# Patient Record
Sex: Female | Born: 1945 | Race: Black or African American | Hispanic: No | Smoking: Former smoker
Health system: Southern US, Community
[De-identification: ages and names within clinical notes are randomized; demographics above are authoritative.]

## PROBLEM LIST (undated history)

## (undated) DIAGNOSIS — F4320 Adjustment disorder, unspecified: Secondary | ICD-10-CM

## (undated) DIAGNOSIS — I10 Essential (primary) hypertension: Secondary | ICD-10-CM

## (undated) DIAGNOSIS — N189 Chronic kidney disease, unspecified: Secondary | ICD-10-CM

## (undated) DIAGNOSIS — K219 Gastro-esophageal reflux disease without esophagitis: Secondary | ICD-10-CM

## (undated) DIAGNOSIS — R519 Headache, unspecified: Secondary | ICD-10-CM

## (undated) DIAGNOSIS — M1990 Unspecified osteoarthritis, unspecified site: Secondary | ICD-10-CM

## (undated) DIAGNOSIS — E559 Vitamin D deficiency, unspecified: Secondary | ICD-10-CM

## (undated) DIAGNOSIS — E785 Hyperlipidemia, unspecified: Secondary | ICD-10-CM

## (undated) DIAGNOSIS — M25552 Pain in left hip: Secondary | ICD-10-CM

## (undated) DIAGNOSIS — R51 Headache: Secondary | ICD-10-CM

## (undated) DIAGNOSIS — J309 Allergic rhinitis, unspecified: Secondary | ICD-10-CM

## (undated) DIAGNOSIS — M25551 Pain in right hip: Secondary | ICD-10-CM

## (undated) DIAGNOSIS — N289 Disorder of kidney and ureter, unspecified: Secondary | ICD-10-CM

## (undated) DIAGNOSIS — Z9889 Other specified postprocedural states: Secondary | ICD-10-CM

## (undated) HISTORY — DX: Pain in left hip: M25.551

## (undated) HISTORY — DX: Headache: R51

## (undated) HISTORY — DX: Disorder of kidney and ureter, unspecified: N28.9

## (undated) HISTORY — DX: Hyperlipidemia, unspecified: E78.5

## (undated) HISTORY — DX: Vitamin D deficiency, unspecified: E55.9

## (undated) HISTORY — DX: Unspecified osteoarthritis, unspecified site: M19.90

## (undated) HISTORY — PX: BACK SURGERY: SHX140

## (undated) HISTORY — DX: Allergic rhinitis, unspecified: J30.9

## (undated) HISTORY — DX: Essential (primary) hypertension: I10

## (undated) HISTORY — DX: Adjustment disorder, unspecified: F43.20

## (undated) HISTORY — DX: Pain in right hip: M25.552

## (undated) HISTORY — DX: Headache, unspecified: R51.9

## (undated) HISTORY — DX: Other specified postprocedural states: Z98.890

## (undated) HISTORY — DX: Chronic kidney disease, unspecified: N18.9

---

## 1993-12-23 HISTORY — PX: ABDOMINAL HYSTERECTOMY: SHX81

## 1997-11-29 ENCOUNTER — Ambulatory Visit (HOSPITAL_COMMUNITY): Admission: RE | Admit: 1997-11-29 | Discharge: 1997-11-29 | Payer: Self-pay | Admitting: Internal Medicine

## 1998-04-12 ENCOUNTER — Ambulatory Visit (HOSPITAL_COMMUNITY): Admission: RE | Admit: 1998-04-12 | Discharge: 1998-04-12 | Payer: Self-pay | Admitting: Internal Medicine

## 2001-03-18 ENCOUNTER — Other Ambulatory Visit: Admission: RE | Admit: 2001-03-18 | Discharge: 2001-03-18 | Payer: Self-pay | Admitting: Obstetrics & Gynecology

## 2003-03-20 ENCOUNTER — Other Ambulatory Visit: Admission: RE | Admit: 2003-03-20 | Discharge: 2003-03-20 | Payer: Self-pay | Admitting: Obstetrics & Gynecology

## 2004-06-10 ENCOUNTER — Other Ambulatory Visit: Admission: RE | Admit: 2004-06-10 | Discharge: 2004-06-10 | Payer: Self-pay | Admitting: Obstetrics & Gynecology

## 2004-07-01 ENCOUNTER — Encounter: Admission: RE | Admit: 2004-07-01 | Discharge: 2004-07-01 | Payer: Self-pay | Admitting: Obstetrics & Gynecology

## 2005-03-14 ENCOUNTER — Encounter: Admission: RE | Admit: 2005-03-14 | Discharge: 2005-03-14 | Payer: Self-pay | Admitting: Internal Medicine

## 2006-08-25 HISTORY — PX: BACK SURGERY: SHX140

## 2006-12-25 ENCOUNTER — Encounter: Admission: RE | Admit: 2006-12-25 | Discharge: 2006-12-25 | Payer: Self-pay | Admitting: Internal Medicine

## 2007-09-22 ENCOUNTER — Ambulatory Visit (HOSPITAL_COMMUNITY): Admission: RE | Admit: 2007-09-22 | Discharge: 2007-09-22 | Payer: Self-pay | Admitting: Neurosurgery

## 2007-10-14 ENCOUNTER — Inpatient Hospital Stay (HOSPITAL_COMMUNITY): Admission: RE | Admit: 2007-10-14 | Discharge: 2007-10-17 | Payer: Self-pay | Admitting: Neurosurgery

## 2008-09-05 IMAGING — CR DG CHEST 2V
2 series · 2 of 2 positions shown · non-contrast
Comparison: Chest 2 views, 03/14/05.

CLINICAL DATA: 61 year-old-female with spondylolisthesis.  Preop for surgery. 
 CHEST - 2 VIEW:

[view not recorded (1 of 2)]
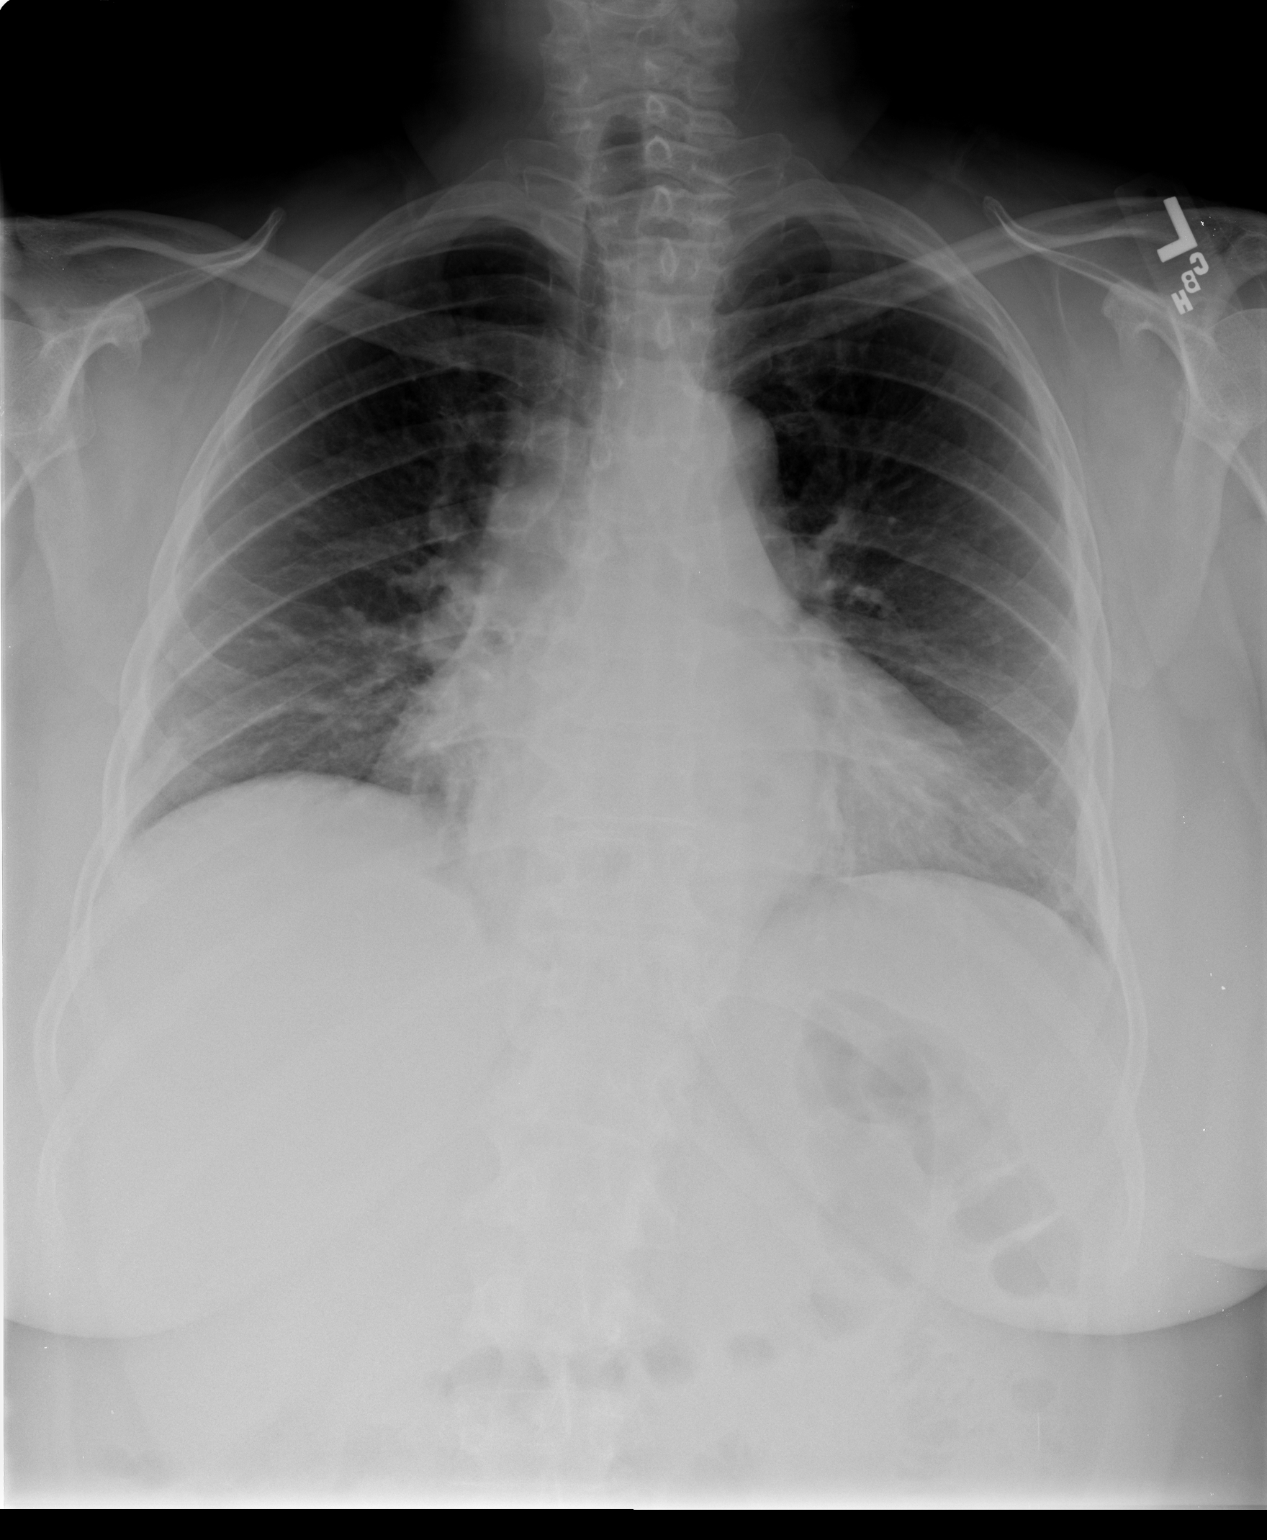

[view not recorded (2 of 2)]
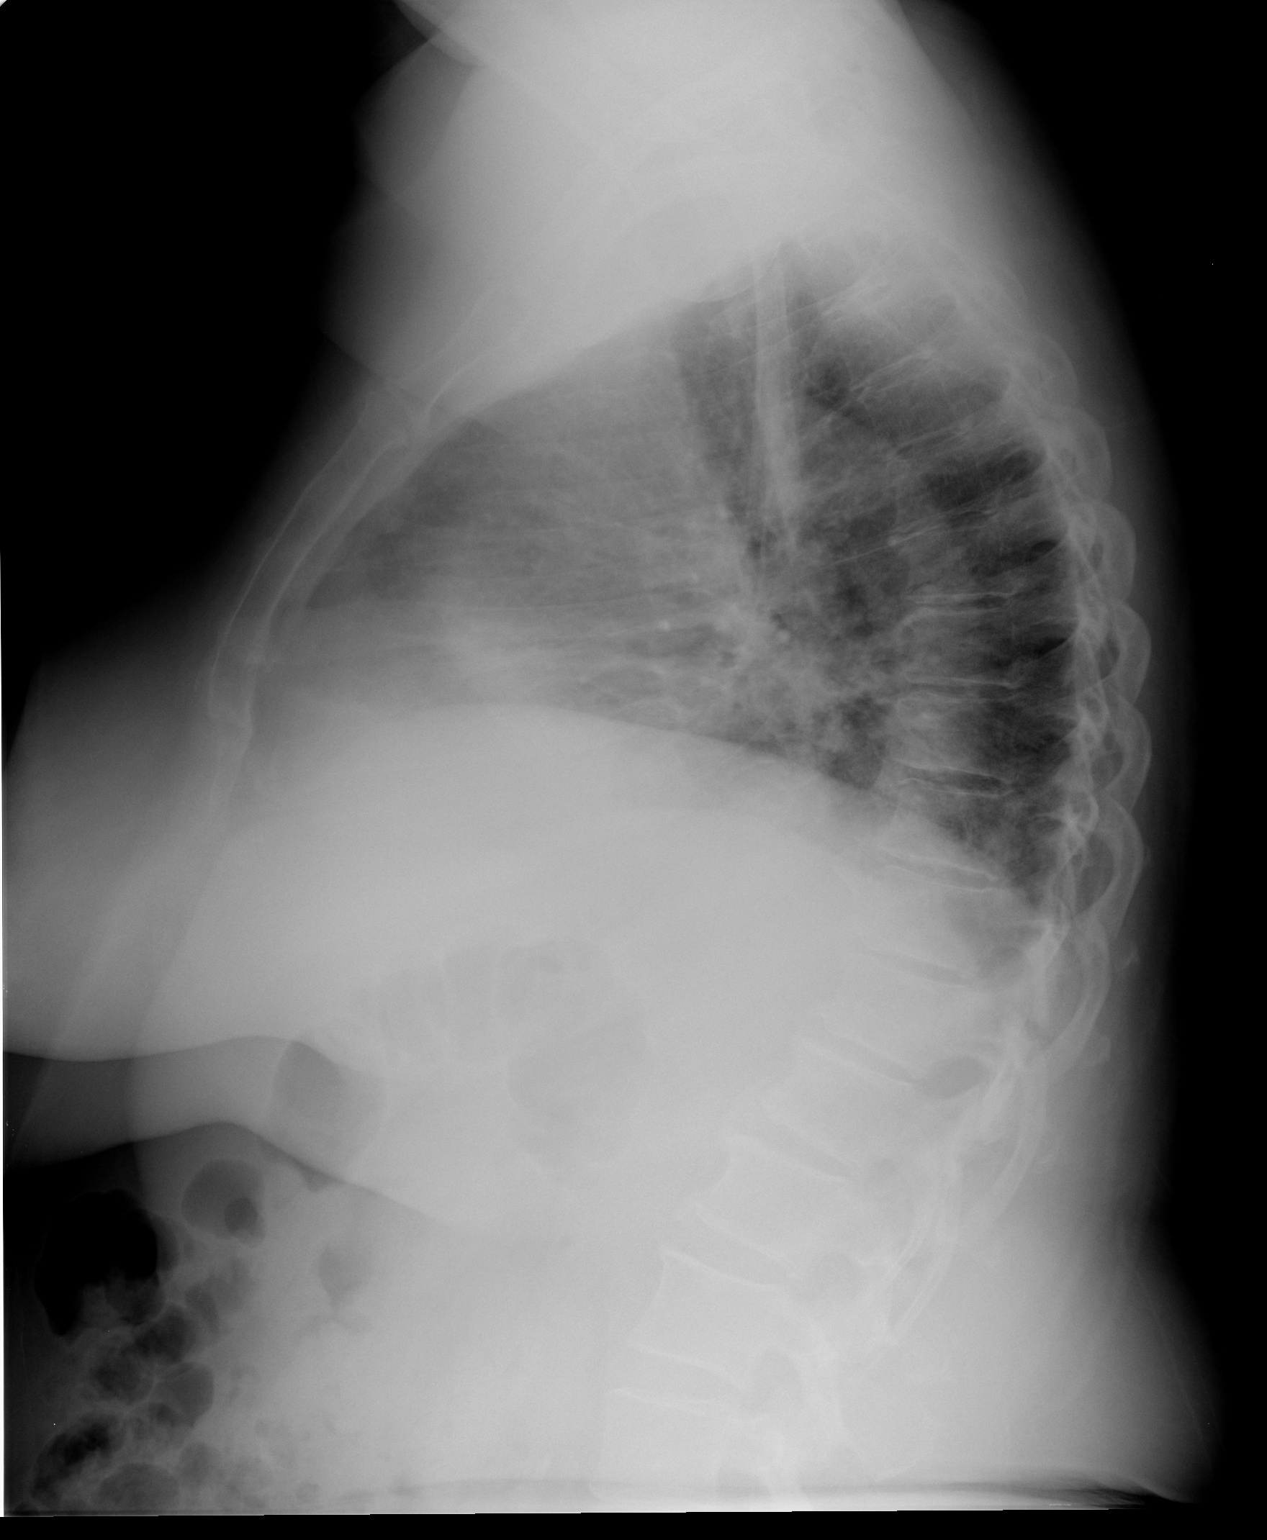

[2 of 2 positions shown; findings below may reference images not displayed]

FINDINGS: There is interval enlargement of the cardiac silhouette compared to prior.  There is mild central venous pulmonary congestion and bibasilar atelectasis.  No evidence of focal infiltrate.  The lateral projection is hypoventilatory.  No pneumothorax.
IMPRESSION: 1.  Interval increase in cardiac silhouette and mild central venous pulmonary congestion. 
 2.  Mild bibasilar atelectasis. 
 3.  No overt pulmonary edema or focal consolidation.

## 2008-09-11 IMAGING — CR DG LUMBAR SPINE 2-3V
1 series · 1 of 1 positions shown · non-contrast
Comparison: Lumbar MR exam 09/22/07.

CLINICAL DATA: Localization for lumbar surgery.
 LUMBAR SPINE ? 2 VIEW:

[view not recorded]
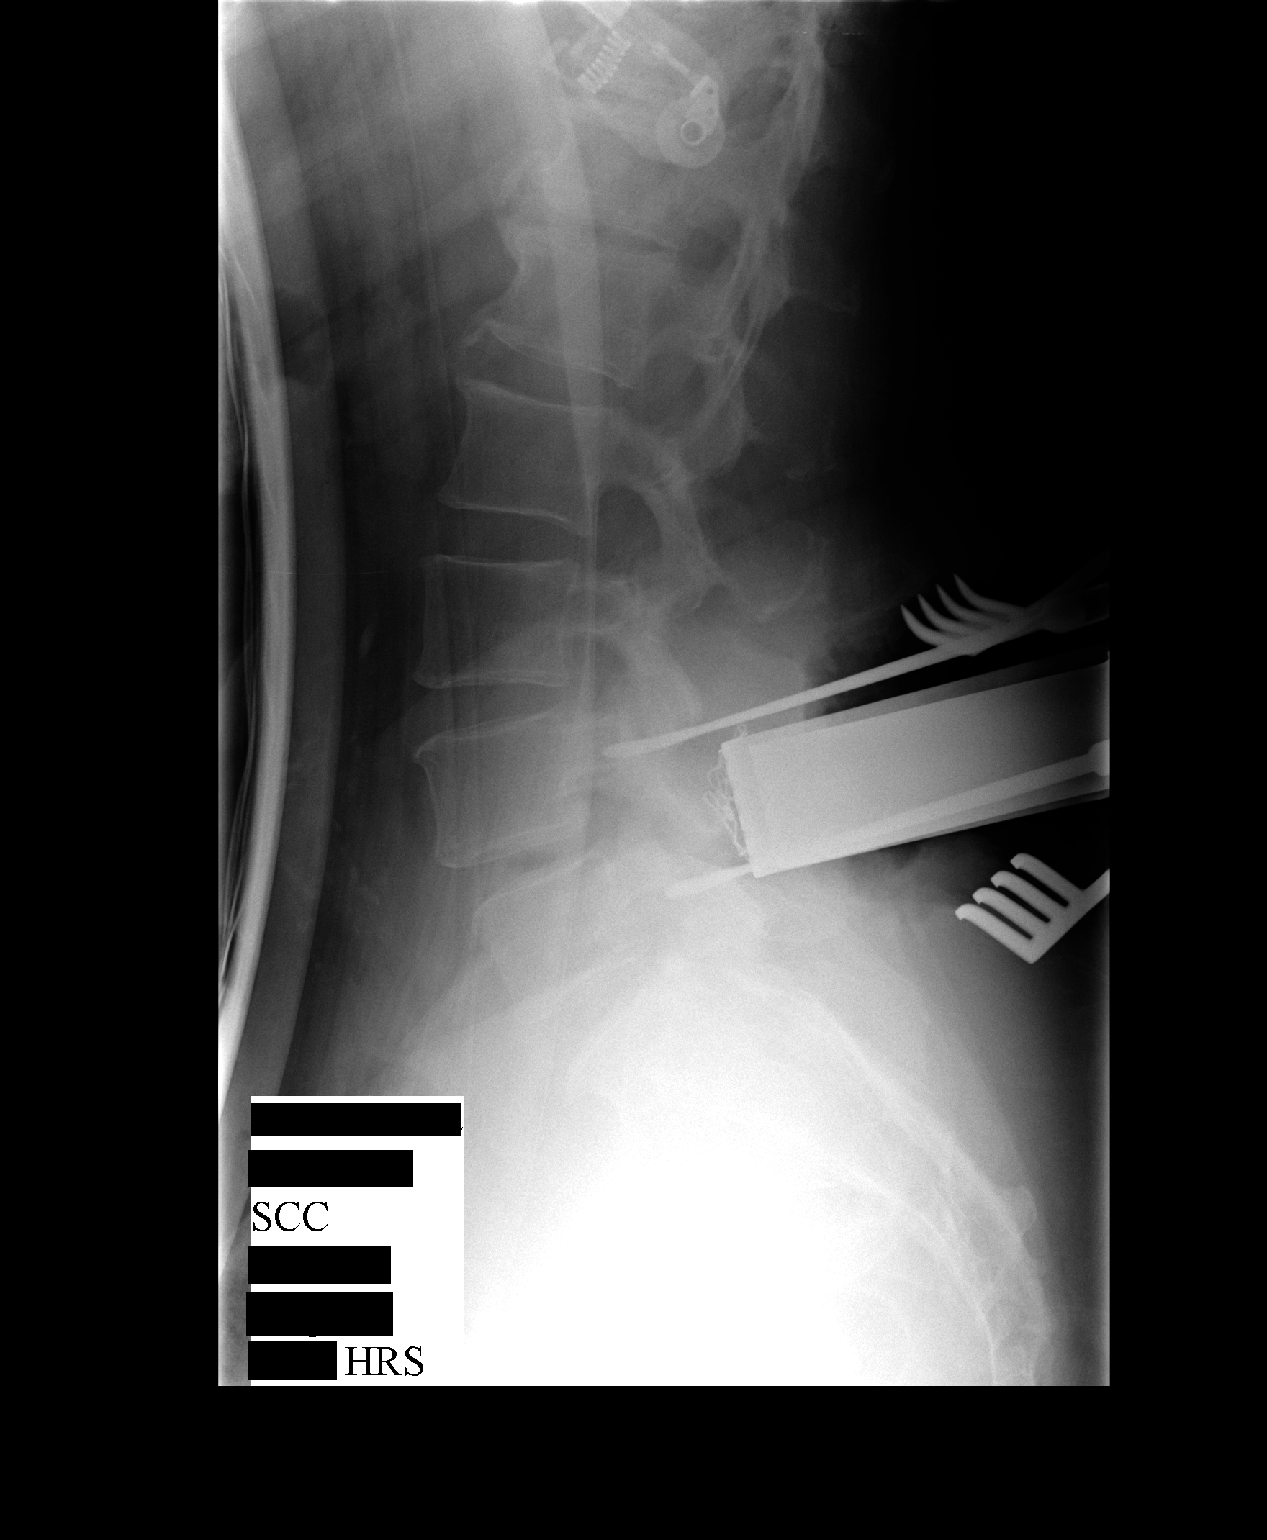

[1 of 1 positions shown; findings below may reference images not displayed]

FINDINGS: Film #1 at 7097 hours ? lateral spine from the operating room shows instruments posterior to the neural canal at L4-5.  
 Film #2 at 7197 hours - there are instruments overlying the pedicles of L4 and L5.
IMPRESSION: Localization for surgery as above.

## 2008-09-11 IMAGING — RF DG LUMBAR SPINE 2-3V
1 series · 2 of 2 positions shown · non-contrast
Comparison: none

CLINICAL DATA: 61 year-old, L4-5 fusion.
LUMBAR SPINE ? 2 VIEW:

[Series 1: run · 2 of 2 slices shown]
[im 1/2]
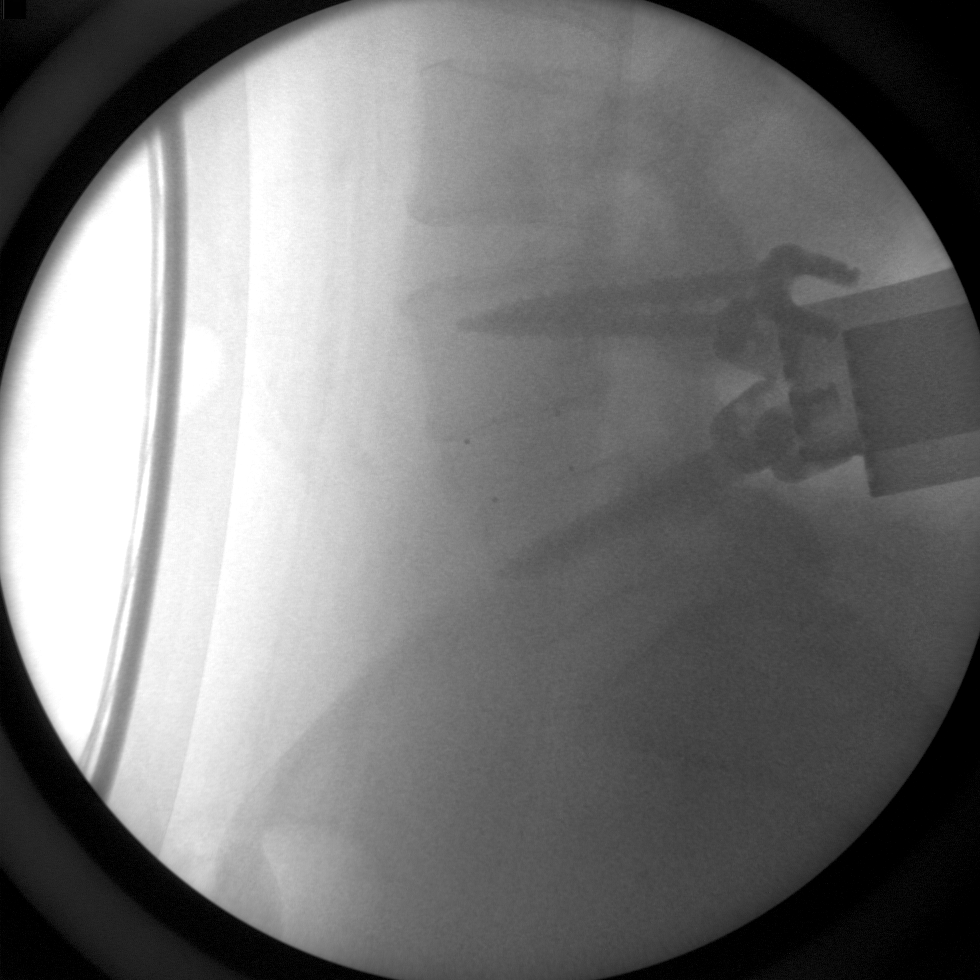
[im 2/2]
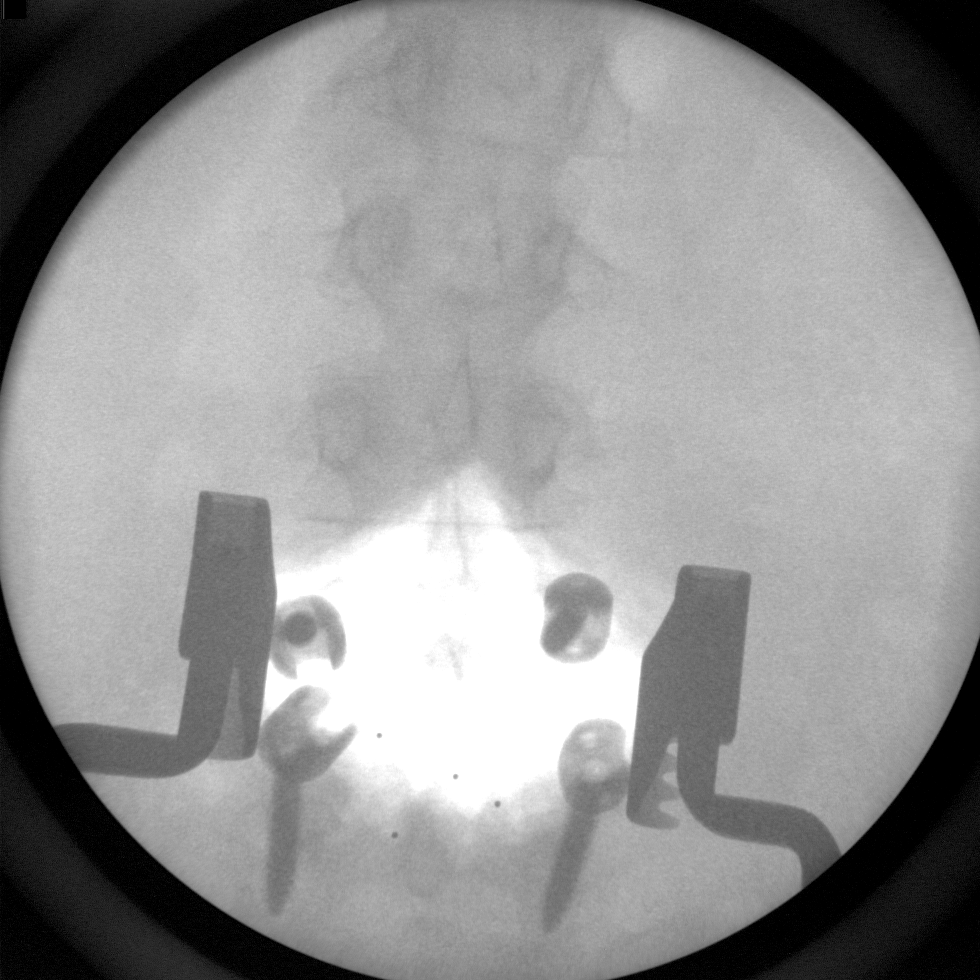

[2 of 2 positions shown; findings below may reference images not displayed]

FINDINGS: Two intraoperative spot films lumbar spine demonstrate pedicle screws in L-4 and L-5 along with interbody bone spacer at L4-5. The alignment is normal. No complicating features are demonstrated.
IMPRESSION: L4-5 fusion hardware and interbody bone plug without complicating features.

## 2008-12-15 ENCOUNTER — Other Ambulatory Visit: Admission: RE | Admit: 2008-12-15 | Discharge: 2008-12-15 | Payer: Self-pay | Admitting: Internal Medicine

## 2011-01-07 NOTE — Op Note (Signed)
NAME:  Susan Tucker, Susan Tucker NO.:  [id number]   MEDICAL RECORD NO.:  [id number]          PATIENT TYPE:  INP   LOCATION:  3006                         FACILITY:  [facility]   PHYSICIAN:  Clydene Fake, M.D.  DATE OF BIRTH:  04-13-1946   DATE OF PROCEDURE:  10/14/2007  DATE OF DISCHARGE:                               OPERATIVE REPORT   DIAGNOSIS:  Unstable spondylolisthesis, stenosis, spondylosis L4-5.   POSTOPERATIVE DIAGNOSIS:  Unstable spondylolisthesis, stenosis,  spondylosis L4-5.   PROCEDURE:  Two-level decompressive laminectomy at L4-L5, posterior  lumbar interbody fusion at L4-5, Saber interbody cage at L4-5, segmented  Expedium pedicle screw fixation at L4-5, posterolateral fusion L4-5,  autograft same incision, and fuse BMP.   SURGEON:  Clydene Fake, M.D.   ASSISTANT:  Stefani Dama, M.D.   General endotracheal tube anesthesia.   ESTIMATED BLOOD LOSS:  200 mL.   BLOOD GIVEN:  None.   COMPLICATIONS:  None.   REASON FOR PROCEDURE:  The patient is a 65 year old woman who has had  back and leg pain.  Epidural injections have helped but do not last.  MRI and x-rays show unstable spondylolisthesis and progressing stenosis  of that area, and patient brought in for decompression and fusion   PROCEDURE IN DETAIL:  The patient was brought to the operating room.  General anesthesia induced.  The patient was placed in a prone position  on a Wilson frame with all pressure points padded.  The patient was  prepped and draped in a sterile fashion.  Incision was made in the lower  lumbar spine.  Incision then taken down to fascia, and hemostasis was  obtained with Bovie cauterization.  The fascia was incised over the L3,  4, 5 spinous process, and dissection taken out over the lamina to the  facets.  Markers placed at the 4-5 interspace.  X-rays were obtained  confirming our positioning.  Contralateral side was exposed.  We exposed  transverse processes of L4 and  5 out laterally, and self-retaining  retractors were placed.  Markers were then placed at the pedicle entry  points of the transverse process of 4 and 5, and another x-ray was  obtained confirming our positioning.  Decompressive laminectomy was then  done with Leksell rongeurs and Kerrison punches, removing the spinous  processes and most of the lamina of L4 and top of L5, and facetectomies  were done at 4-5 and decompressed the lateral gutters to decompress the  pars so that we had good decompression of both the 4 roots and 5 roots  bilaterally.  All of the bone that was removed was cleaned from its soft  tissue, chopped into small pieces, and kept for use later in the case.  We explored the epidural space, got hemostasis with bipolar  cauterization, incised the disk space, and diskectomy performed on each  side.  We distracted the interspace up to 11 mm, getting some resolution  of spondylolisthesis, and decompressing the canal and more.  We prepared  the interbody space for interbody fusion using the broaches for the  Saber cages and curettes and pituitary rongeurs  to clean up the disk.  When we were finished, we had good decompression and central canal  decompression.  The disk space as well prepared for interbody fusion.  We packed two 11 high by 9 wide Saber interbody cages with infused BMP  and autograft bone.  We then packed the interspace with autograft bone,  and while holding distraction on one side, tapped the cage in the  contralateral side and removed the distractor and tapped a second cage  in the ipsilateral side.  We had good position of the cages, and nerve  roots and thecal sac were still decompressed.  There was no bony  compression of these areas.  Irrigated with antibiotic solutionafter  hemostasis in lateral gutters with Gelfoam, then decorticated the  lateral facets and transverse processes at L4 to prepare for  posterolateral fusion.  Using fluoroscopy and  intraoperative guide, we  found the pedicle entry point for L4, decorticated with a high-speed  drill, and placed a probe down the pedicle, felt we had good bony  circumference using a small probe, tapped the pedicle, was then placed a  25-mm Expedium screw.  This process was repeated on the same side at L5  and then repeated at L4-L5 on the contralateral side.  Four screws were  placed in good position.  Lateral fluoroscopic imaging and AP  fluoroscopic imaging confirmed our positioning.  Rods were placed in the  screw heads.  Locking nuts were placed, and these were tightened down.  Infuse BMP was then packed in the posterolateral gutters along with  autograft bone for posterolateral fusion from L4-5.  We again removed  the Gelfoam.  We irrigated with antibiotic solution and checked the  nerve roots.  We had a good decompression and placed just a small amount  of Gelfoam over the lateral gutters so no bone graft could fall in it  and impinge nerve root.  Retractors were removed.  We had good  hemostasis.  The fascia was closed with 0 Vicryl interrupted sutures.  Subcutaneous tissue was closed with 0, 2-0 and 3-0 Vicryl interrupted  sutures.  Skin closed with benzoin and Steri-Strips.  Dressing was  placed.  The patient was placed back in supine position, awakened from  anesthesia, and transferred to the recovery room in stable condition.           ______________________________  Clydene Fake, M.D.     JRH/MEDQ  D:  10/14/2007  T:  10/15/2007  Job:  [id number]

## 2011-01-10 NOTE — Discharge Summary (Signed)
NAME:  Susan Tucker, Susan Tucker NO.:  [id number]   MEDICAL RECORD NO.:  [id number]          PATIENT TYPE:  INP   LOCATION:  3006                         FACILITY:  [facility]   PHYSICIAN:  Clydene Fake, M.D.  DATE OF BIRTH:  04/01/1946   DATE OF ADMISSION:  10/14/2007  DATE OF DISCHARGE:  10/17/2007                               DISCHARGE SUMMARY   DIAGNOSES:  Unstable spondylolisthesis with stenosis and spondylosis, L4-  5.   DISCHARGE DIAGNOSIS:  Unstable spondylolisthesis with stenosis and  spondylosis, L4-5.   PROCEDURE:  1. Two level decompressive laminectomy, L4-5.  2. Posterior right interbody fusion at L4-5 with  Saber interbody      cages, Expedium nonsegmented screw fixation.  3. Posterolateral infusion with Infuse.   REASON FOR ADMISSION:  The patient is a 65 year old woman who has had  back and leg pain. Epidural injections have helped but not given any  lasting relief.  She underwent x-rays which showed unstable  spondylolisthesis with stenosis.  The patient was brought for  decompression and fusion.   HOSPITAL COURSE:  The patient underwent surgery as listed above without  complication. Postop, was transferred  to recovery and then to the  floor.  There she was rapidly moving around, slowly increasing her  activity.  PT and OT worked with the patient a couple days, she did  well.  Her incision remained clean, dry, and intact.  She had much less  leg pain and was ambulating much better than at prehospitalization.  On  October 17, 2007, she is doing well and was discharged home in stable  condition.   DISCHARGE MEDICATIONS:  Same as prehospitalization plus Percocet and  Flexeril p.r.n.  Follow up in my office in 3 or 4 weeks.  No strenuous  activity, up with braces.           ______________________________  Clydene Fake, M.D.     JRH/MEDQ  D:  11/04/2007  T:  11/04/2007  Job:  [id number]

## 2011-05-16 LAB — CBC
HCT: 37.1
Hemoglobin: 12.9
MCHC: 34.7
MCV: 97.4
Platelets: 318
RBC: 3.81 — ABNORMAL LOW
RDW: 13.9
WBC: 6.8

## 2011-05-16 LAB — BASIC METABOLIC PANEL
BUN: 19
CO2: 24
Calcium: 10.4
Chloride: 108
Creatinine, Ser: 1.04
GFR calc Af Amer: >60
GFR calc non Af Amer: 54 — ABNORMAL LOW
Glucose, Bld: 87
Potassium: 4.4
Sodium: 141

## 2011-05-16 LAB — TYPE AND SCREEN
ABO/RH(D): B POS
Antibody Screen: NEGATIVE

## 2011-05-16 LAB — DIFFERENTIAL
Basophils Absolute: 0
Basophils Relative: 0
Eosinophils Absolute: 0.1
Eosinophils Relative: 2
Lymphocytes Relative: 16
Lymphs Abs: 1.1
Monocytes Absolute: 0.6
Monocytes Relative: 8
Neutro Abs: 5
Neutrophils Relative %: 74

## 2011-05-16 LAB — URINALYSIS, ROUTINE W REFLEX MICROSCOPIC
Bilirubin Urine: NEGATIVE
Glucose, UA: NEGATIVE
Hgb urine dipstick: NEGATIVE
Ketones, ur: NEGATIVE
Nitrite: NEGATIVE
Protein, ur: NEGATIVE
Specific Gravity, Urine: 1.016
Urobilinogen, UA: 0.2
pH: 5.5

## 2011-05-16 LAB — PROTIME-INR
INR: 1
Prothrombin Time: 13.7

## 2011-05-16 LAB — ABO/RH: ABO/RH(D): B POS

## 2011-05-16 LAB — APTT: aPTT: 27

## 2012-02-13 ENCOUNTER — Ambulatory Visit
Admission: RE | Admit: 2012-02-13 | Discharge: 2012-02-13 | Disposition: A | Discharge status: Home / Self Care | Payer: BC Managed Care – PPO | Source: Ambulatory Visit | Attending: Internal Medicine | Admitting: Internal Medicine

## 2012-02-13 ENCOUNTER — Other Ambulatory Visit: Payer: Self-pay | Admitting: Internal Medicine

## 2012-02-13 DIAGNOSIS — M25552 Pain in left hip: Secondary | ICD-10-CM

## 2012-09-09 ENCOUNTER — Encounter (HOSPITAL_COMMUNITY): Payer: Self-pay

## 2012-09-09 DIAGNOSIS — M25551 Pain in right hip: Secondary | ICD-10-CM

## 2012-09-09 DIAGNOSIS — I10 Essential (primary) hypertension: Secondary | ICD-10-CM

## 2012-09-09 DIAGNOSIS — F4320 Adjustment disorder, unspecified: Secondary | ICD-10-CM

## 2012-09-09 DIAGNOSIS — E785 Hyperlipidemia, unspecified: Secondary | ICD-10-CM | POA: Insufficient documentation

## 2012-09-09 DIAGNOSIS — N181 Chronic kidney disease, stage 1: Secondary | ICD-10-CM | POA: Insufficient documentation

## 2012-09-09 DIAGNOSIS — E559 Vitamin D deficiency, unspecified: Secondary | ICD-10-CM | POA: Insufficient documentation

## 2015-10-15 ENCOUNTER — Ambulatory Visit: Payer: Self-pay | Admitting: Podiatry

## 2015-10-17 ENCOUNTER — Ambulatory Visit: Payer: Self-pay | Admitting: Podiatry

## 2015-10-29 ENCOUNTER — Ambulatory Visit (INDEPENDENT_AMBULATORY_CARE_PROVIDER_SITE_OTHER): Payer: Medicare Other

## 2015-10-29 ENCOUNTER — Encounter: Payer: Self-pay | Admitting: Podiatry

## 2015-10-29 ENCOUNTER — Ambulatory Visit (INDEPENDENT_AMBULATORY_CARE_PROVIDER_SITE_OTHER): Payer: Medicare Other | Admitting: Podiatry

## 2015-10-29 VITALS — BP 119/84 | HR 86 | Resp 16 | Ht 64.5 in | Wt 151.0 lb

## 2015-10-29 DIAGNOSIS — M21619 Bunion of unspecified foot: Secondary | ICD-10-CM | POA: Diagnosis not present

## 2015-10-29 DIAGNOSIS — M2040 Other hammer toe(s) (acquired), unspecified foot: Secondary | ICD-10-CM

## 2015-10-29 DIAGNOSIS — L84 Corns and callosities: Secondary | ICD-10-CM | POA: Diagnosis not present

## 2015-10-29 NOTE — Progress Notes (Signed)
   Subjective:    Patient ID: Susan Tucker, female    DOB: 1945-10-23, 70 y.o.   MRN: [id number]  HPI Patient presents with bilateral foot pain; 2nd toes. Pt stated, "Toes causing pain when wearing shoes"; x1 yr.   Review of Systems  HENT: Positive for sinus pressure.   Gastrointestinal: Positive for abdominal distention.  Musculoskeletal: Positive for arthralgias.  All other systems reviewed and are negative.      Objective:   Physical Exam        Assessment & Plan:

## 2015-10-30 ENCOUNTER — Other Ambulatory Visit: Payer: Self-pay | Admitting: Gastroenterology

## 2015-10-30 DIAGNOSIS — R1084 Generalized abdominal pain: Secondary | ICD-10-CM

## 2015-10-31 NOTE — Progress Notes (Signed)
Subjective:     Patient ID: Susan Tucker, female   DOB: 1946-04-22, 70 y.o.   MRN: [id number]  HPI patient states she's having a lot of pain second toe right over left with rigid contracture and structural bunion. It's been getting worse over the last years and makes it hard to wear shoe gear comfortably   Review of Systems  All other systems reviewed and are negative.      Objective:   Physical Exam  Constitutional: She is oriented to person, place, and time.  Cardiovascular: Intact distal pulses.   Musculoskeletal: Normal range of motion.  Neurological: She is oriented to person, place, and time.  Skin: Skin is warm and dry.  Nursing note and vitals reviewed.  neurovascular status found to be intact with muscle strength adequate and range of motion mildly diminished subtalar midtarsal joint. Structural forefoot damage with deviation of the big toe against the second toe right and left with rigid contracture digit 2 right over left with keratotic lesion and inflammation around the proximal interphalangeal joint. It is painful when pressed     Assessment:     Structural deformities of both feet was structural bunion hammertoe deformity and rigid contracture digit 2 right over left foot with pain    Plan:     H&P and x-rays reviewed with patient. At this point conservative debridement and padding was accomplished with discussion about digital fusion and possible structural bunion correction. Patient be seen back when symptomatically we will decide what's best long-term  X-ray report indicates rigid contracture digit 2 right over left with structural bunion deformity bilateral and lateral movement of the hallux bilateral

## 2015-11-07 ENCOUNTER — Other Ambulatory Visit: Payer: Self-pay

## 2015-11-14 ENCOUNTER — Ambulatory Visit
Admission: RE | Admit: 2015-11-14 | Discharge: 2015-11-14 | Disposition: A | Discharge status: Home / Self Care | Payer: Medicare Other | Source: Ambulatory Visit | Attending: Gastroenterology | Admitting: Gastroenterology

## 2015-11-14 DIAGNOSIS — R1084 Generalized abdominal pain: Secondary | ICD-10-CM

## 2015-11-26 ENCOUNTER — Encounter: Payer: Self-pay | Admitting: Podiatry

## 2015-11-26 ENCOUNTER — Ambulatory Visit (INDEPENDENT_AMBULATORY_CARE_PROVIDER_SITE_OTHER): Payer: Medicare Other | Admitting: Podiatry

## 2015-11-26 DIAGNOSIS — L84 Corns and callosities: Secondary | ICD-10-CM

## 2015-11-26 DIAGNOSIS — M2040 Other hammer toe(s) (acquired), unspecified foot: Secondary | ICD-10-CM

## 2015-11-26 DIAGNOSIS — M21619 Bunion of unspecified foot: Secondary | ICD-10-CM

## 2015-11-27 NOTE — Progress Notes (Signed)
Subjective:     Patient ID: Susan Tucker, female   DOB: 1945-12-15, 70 y.o.   MRN: [id number]  HPI patient presents concerned about structural bunion deformity and digital deformities along with corn callus formation   Review of Systems     Objective:   Physical Exam Neurovascular status unchanged with muscle strength adequate range of motion moderately diminished subtalar midtarsal joint. Patient's noted to have large structural bunion deformity bilateral with keratotic lesions and deformity with elevation of the lesser digits bilateral    Assessment:     Significant structural deformity bilateral with hammertoe deformity and structural bunion deformity    Plan:     Reviewed condition to great length discussing conservative surgical treatments. We could consider fusion procedure along with metatarsal osteotomy but were to hold off currently and continued use pads and shoe gear modifications. Educated her on surgery and she'll reappoint as needed

## 2016-05-28 ENCOUNTER — Ambulatory Visit (HOSPITAL_COMMUNITY)
Admission: EM | Admit: 2016-05-28 | Discharge: 2016-05-28 | Disposition: A | Discharge status: Home / Self Care | Payer: Medicare Other | Attending: Family Medicine | Admitting: Family Medicine

## 2016-05-28 ENCOUNTER — Encounter (HOSPITAL_COMMUNITY): Payer: Self-pay | Admitting: Emergency Medicine

## 2016-05-28 ENCOUNTER — Ambulatory Visit (INDEPENDENT_AMBULATORY_CARE_PROVIDER_SITE_OTHER): Payer: Medicare Other

## 2016-05-28 DIAGNOSIS — S39012A Strain of muscle, fascia and tendon of lower back, initial encounter: Secondary | ICD-10-CM | POA: Diagnosis not present

## 2016-05-28 MED ORDER — CYCLOBENZAPRINE HCL 5 MG PO TABS: 5.0000 mg | ORAL_TABLET | Freq: Three times a day (TID) | ORAL | 0 refills | Status: DC

## 2016-05-28 MED ORDER — DICLOFENAC POTASSIUM 50 MG PO TABS: 50.0000 mg | ORAL_TABLET | Freq: Three times a day (TID) | ORAL | 0 refills | Status: DC

## 2016-05-28 NOTE — ED Triage Notes (Signed)
Pt states she twisted her lower back about one week ago.  She reports pain in her mid back that radiates to the right.  She has tried a heating pad and tylenol with little relief.  Pt has had prior lower back surgery in 2008.

## 2016-05-28 NOTE — ED Provider Notes (Signed)
[facility]    CSN: [id number] Arrival date & time: 05/28/16  1113     History   Chief Complaint Chief Complaint  Patient presents with  . Back Pain    HPI Susan Tucker is a 70 y.o. female.   The history is provided by the patient.  Back Pain  Location:  Lumbar spine Quality:  Stabbing Radiates to:  Does not radiate Pain severity:  Moderate Onset quality:  Sudden (twisting taking out garbage.) Duration:  5 days Progression:  Unchanged Chronicity:  New Relieved by:  None tried Worsened by:  Nothing Ineffective treatments:  None tried Associated symptoms: no abdominal pain, no bladder incontinence, no bowel incontinence, no fever, no leg pain, no numbness and no weakness     Past Medical History:  Diagnosis Date  . Adult situational stress disorder historical  . Allergic rhinitis historical  . Chronic kidney disease   . DJD (degenerative joint disease) historical  . Hip pain, bilateral historical  . Hx of right knee surgery historical   per patient  . Hyperlipidemia historical  . Hypertension   . Renal insufficiency, mild historical  . Sinus headache historical  . Vitamin D deficiency historical    Patient Active Problem List   Diagnosis Date Noted  . Hypertension 09/09/2012  . Hyperlipidemia 09/09/2012  . Adult situational stress disorder 09/09/2012  . Vitamin D deficiency disease 09/09/2012  . Hip pain, bilateral 09/09/2012  . Chronic kidney disease (CKD), stage I 09/09/2012    Past Surgical History:  Procedure Laterality Date  . ABDOMINAL HYSTERECTOMY  12/1993   per patient  . BACK SURGERY      OB History    No data available       Home Medications    Prior to Admission medications   Medication Sig Start Date End Date Taking? Authorizing Provider  atorvastatin (LIPITOR) 10 MG tablet Take 10 mg by mouth daily.   Yes Historical Provider, MD  omeprazole (PRILOSEC) 40 MG capsule Take 40 mg by mouth daily.   Yes Historical  Provider, MD  triamterene-hydrochlorothiazide (MAXZIDE-25) 37.5-25 MG per tablet Take 1 tablet by mouth daily.   Yes Historical Provider, MD  ALPRAZolam Prudy Feeler) 0.5 MG tablet Take 0.5 mg by mouth at bedtime as needed for anxiety.    Historical Provider, MD  Ascorbic Acid (VITAMIN C) 1000 MG tablet Take 1,000 mg by mouth daily.    Historical Provider, MD  cholecalciferol (VITAMIN D) 1000 UNITS tablet Take 1,000 Units by mouth daily.    Historical Provider, MD  Glucosamine-Chondroitin (OSTEO BI-FLEX REGULAR STRENGTH PO) Take by mouth daily.    Historical Provider, MD  lansoprazole (PREVACID) 30 MG capsule Take 30 mg by mouth daily.    Historical Provider, MD  magnesium oxide (MAG-OX) 400 MG tablet Take 400 mg by mouth 2 (two) times daily.    Historical Provider, MD  Multiple Vitamin (MULTIVITAMIN) tablet Take 1 tablet by mouth daily.    Historical Provider, MD  Probiotic Product (PROBIOTIC DAILY PO) Take 1 tablet by mouth daily.    Historical Provider, MD    Family History Family History  Problem Relation Age of Onset  . Cancer Mother   . Arthritis Father   . Gout Father   . Other Father     expired at age 36 due to natural cause per patient    Social History Social History  Substance Use Topics  . Smoking status: Never Smoker  . Smokeless tobacco: Never Used  .  Alcohol use No     Allergies   Review of patient's allergies indicates no known allergies.   Review of Systems Review of Systems  Constitutional: Negative.  Negative for fever.  Respiratory: Negative.   Gastrointestinal: Negative.  Negative for abdominal pain and bowel incontinence.  Genitourinary: Negative.  Negative for bladder incontinence.  Musculoskeletal: Positive for back pain. Negative for gait problem.  Neurological: Negative for weakness and numbness.  All other systems reviewed and are negative.    Physical Exam Triage Vital Signs ED Triage Vitals [05/28/16 1215]  Enc Vitals Group     BP 121/70      Pulse Rate 64     Resp 12     Temp 98.3 F (36.8 C)     Temp Source Oral     SpO2 100 %     Weight      Height      Head Circumference      Peak Flow      Pain Score 7     Pain Loc      Pain Edu?      Excl. in GC?    No data found.   Updated Vital Signs BP 121/70 (BP Location: Left Arm)   Pulse 64   Temp 98.3 F (36.8 C) (Oral)   Resp 12   SpO2 100%   Visual Acuity Right Eye Distance:   Left Eye Distance:   Bilateral Distance:    Right Eye Near:   Left Eye Near:    Bilateral Near:     Physical Exam  Constitutional: She is oriented to person, place, and time. She appears well-developed and well-nourished.  Abdominal: Soft. Bowel sounds are normal. There is no tenderness.  Musculoskeletal: Normal range of motion. She exhibits tenderness.       Back:  Neurological: She is alert and oriented to person, place, and time.  Nursing note and vitals reviewed.    UC Treatments / Results  Labs (all labs ordered are listed, but only abnormal results are displayed) Labs Reviewed - No data to display  EKG  EKG Interpretation None       Radiology No results found. X-rays reviewed and report per radiologist.  Procedures Procedures (including critical care time)  Medications Ordered in UC Medications - No data to display   Initial Impression / Assessment and Plan / UC Course  I have reviewed the triage vital signs and the nursing notes.  Pertinent labs & imaging results that were available during my care of the patient were reviewed by me and considered in my medical decision making (see chart for details).  Clinical Course      Final Clinical Impressions(s) / UC Diagnoses   Final diagnoses:  None    New Prescriptions New Prescriptions   No medications on file     Linna HoffJames D Kindl, MD 05/28/16 1407

## 2016-05-28 NOTE — Discharge Instructions (Signed)
See your spine doctor for recheck.

## 2016-09-27 LAB — BASIC METABOLIC PANEL: Glucose: 87 mg/dL

## 2016-10-12 IMAGING — US US ABDOMEN COMPLETE
1 series · 14 of 25 positions shown · non-contrast
Comparison: None.

CLINICAL DATA: Lower abdominal pain for 2 weeks.

EXAM:
ABDOMEN ULTRASOUND COMPLETE

[Series 1: us abdomen complete · 0.23mm/px · 14 of 71 slices shown]
[im 1/71]
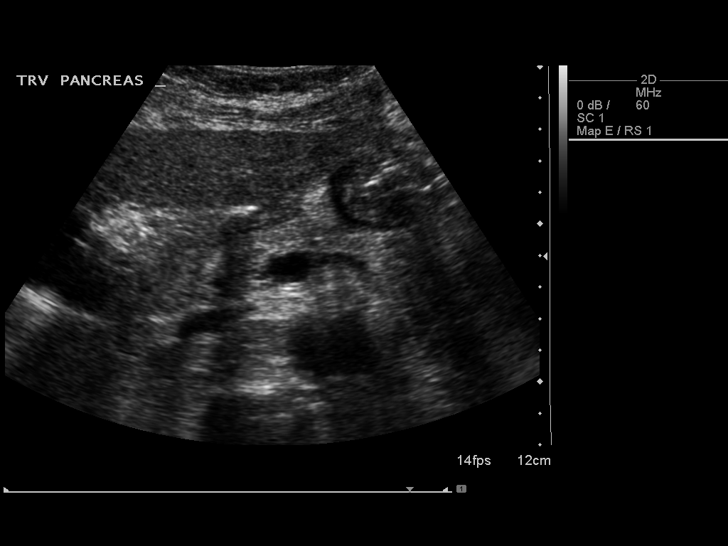
[im 6/71]
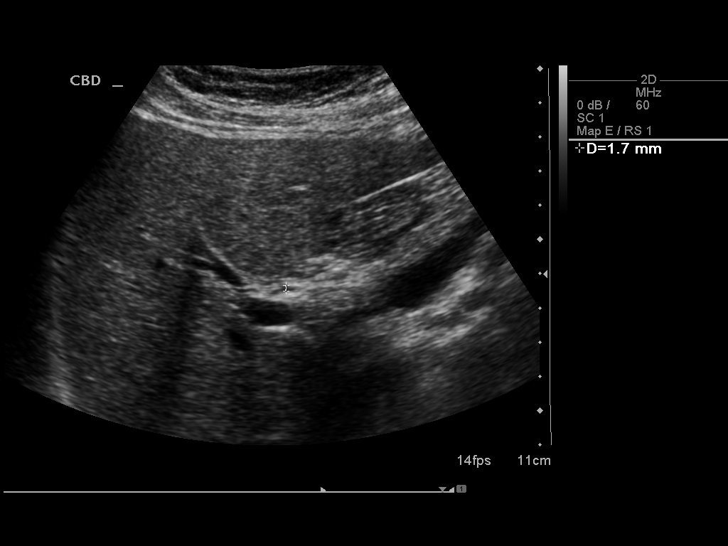
[im 12/71]
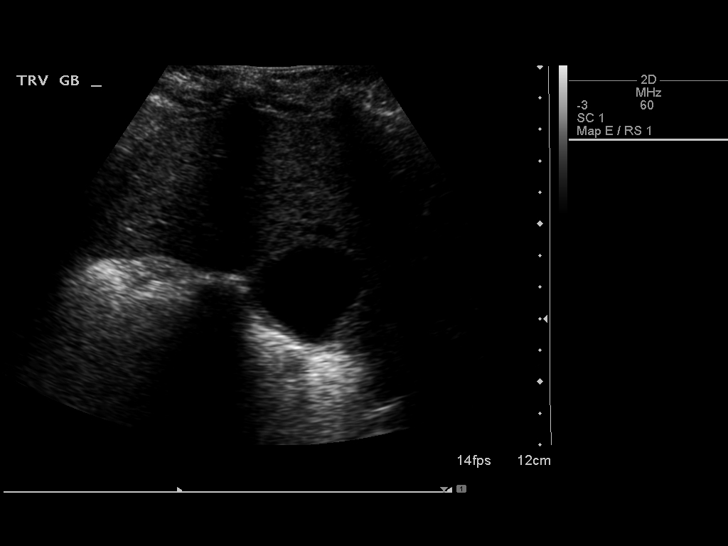
[im 18/71]
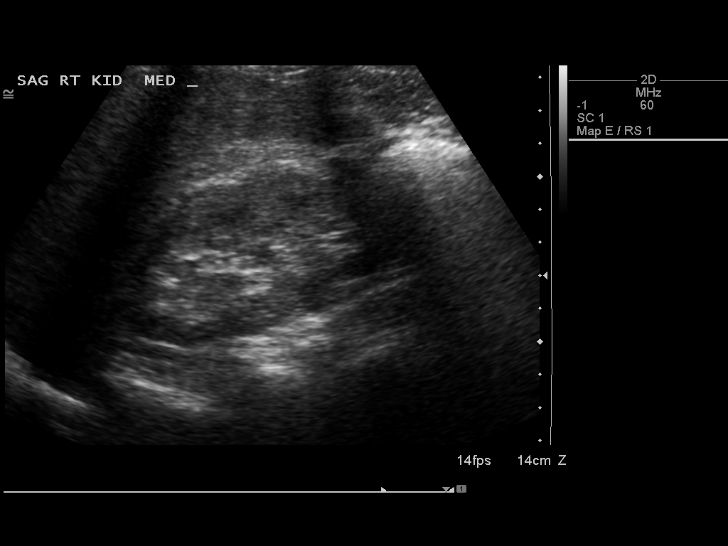
[im 24/71]
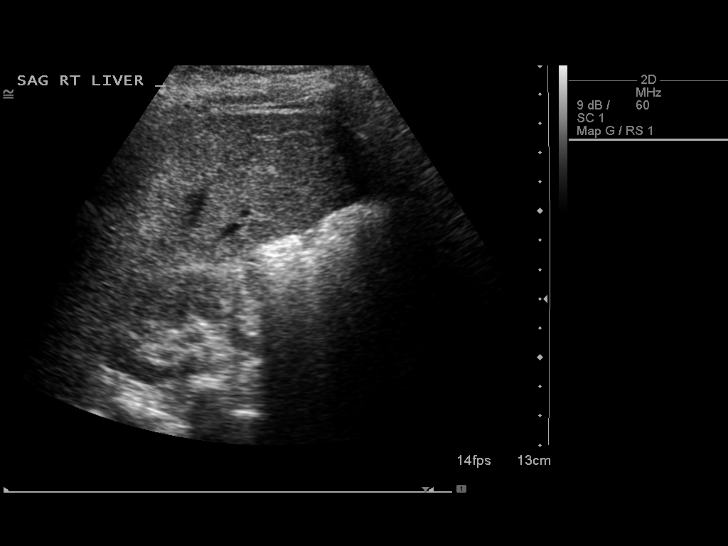
[im 27/71]
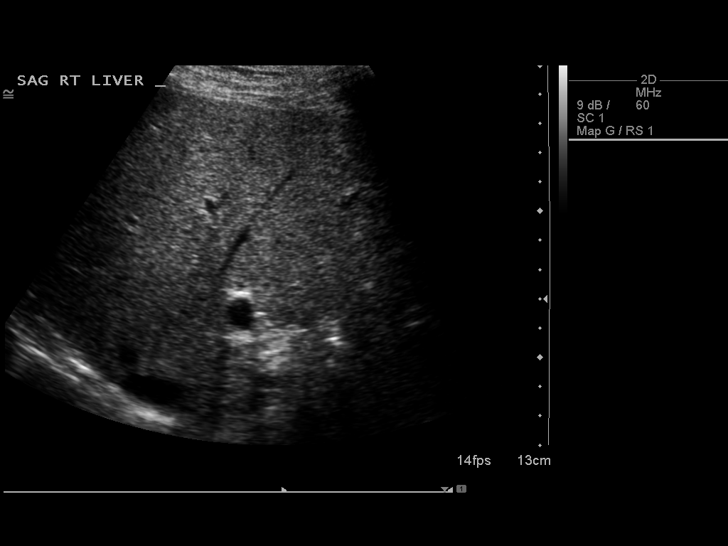
[im 33/71]
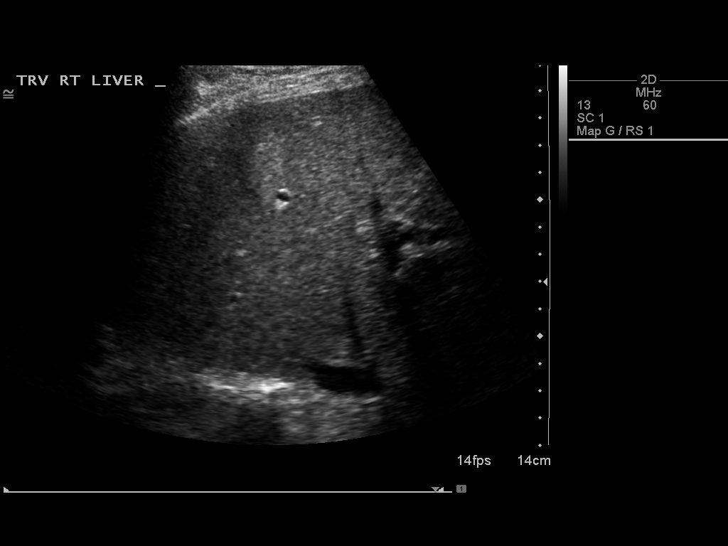
[im 38/71]
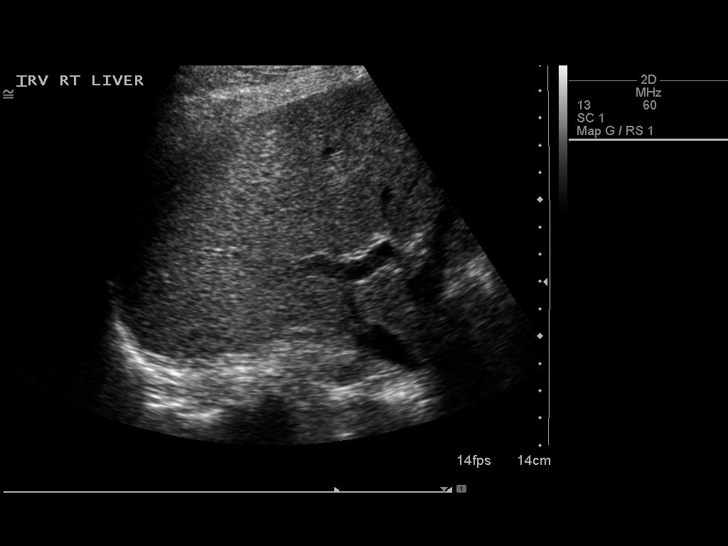
[im 44/71]
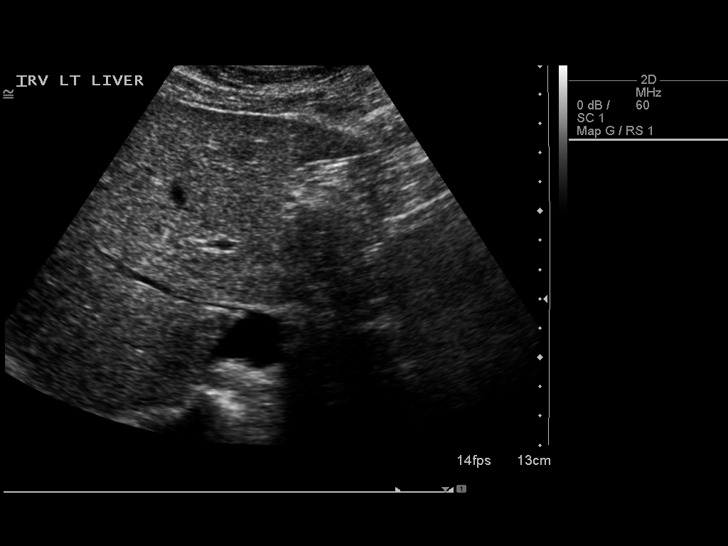
[im 47/71]
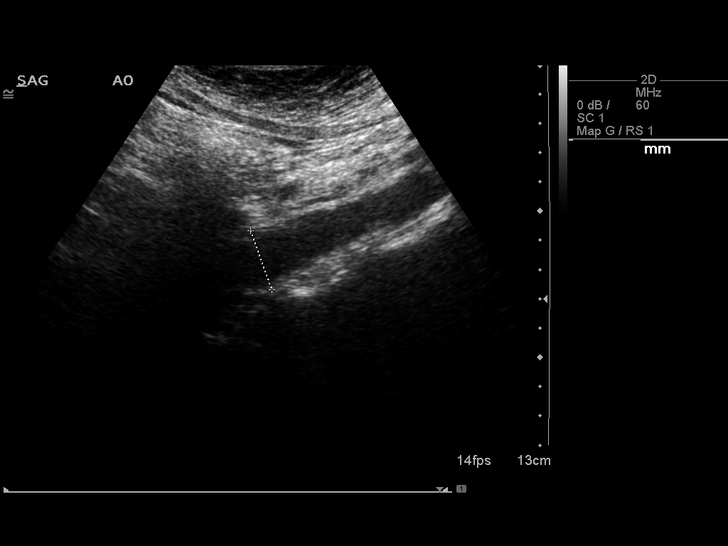
[im 53/71]
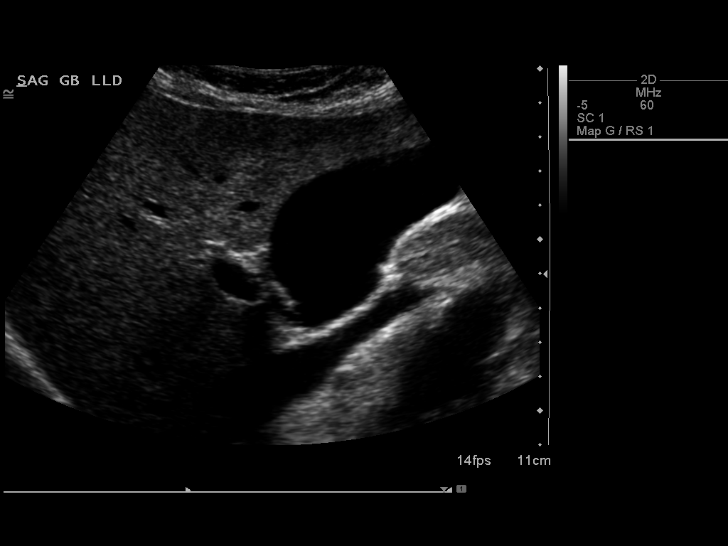
[im 59/71]
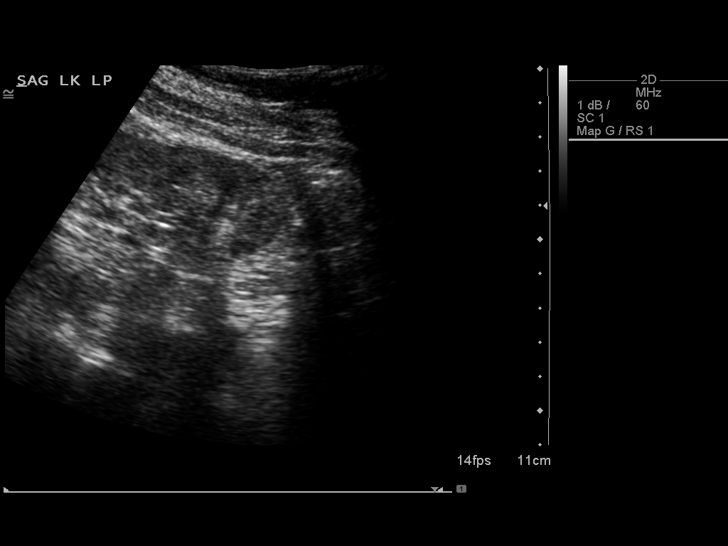
[im 65/71]
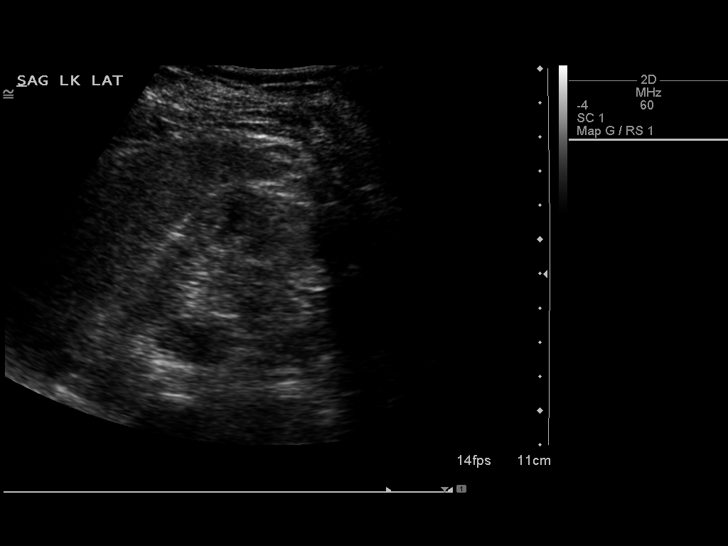
[im 71/71]
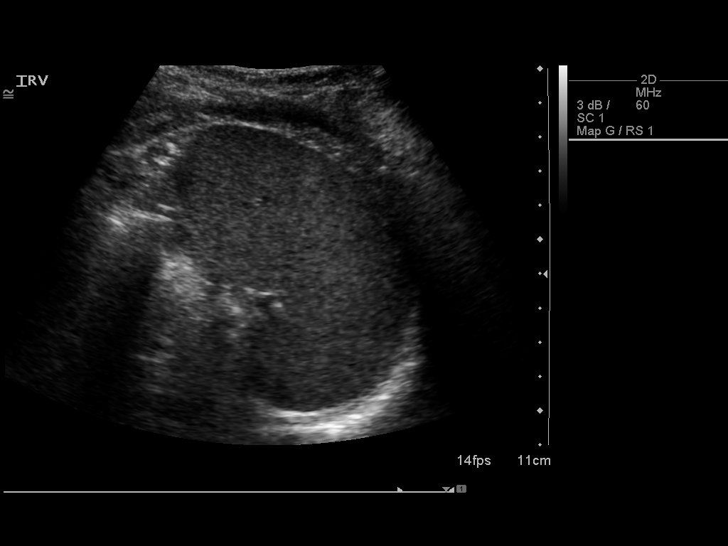

[14 of 25 positions shown; findings below may reference images not displayed]

FINDINGS: Gallbladder: No gallstones or wall thickening visualized. No
sonographic Murphy sign noted by sonographer.

Common bile duct: Diameter: 5.4 mm

Liver: Mildly heterogeneous echotexture without focal abnormality or
biliary dilatation. Patent hepatic and portal veins with normal
directional flow.

IVC: No abnormality visualized.

Pancreas: Visualized portion unremarkable.

Spleen: Size and appearance within normal limits.

Right Kidney: Length: 9.2 cm. Echogenicity within normal limits. No
mass or hydronephrosis visualized.

Left Kidney: Length: 8.7 cm. Echogenicity within normal limits. No
hydronephrosis. Incidental hypoechoic cyst in the lower pole
measures 8 mm.

Abdominal aorta: Aortic atherosclerosis without aneurysm.

Other findings: None.
IMPRESSION: No acute finding by abdominal ultrasound. Incidental 8 mm left renal
cyst

Negative for gallstones or biliary dilatation

Abdominal aortic atherosclerosis.

## 2019-09-16 ENCOUNTER — Ambulatory Visit: Payer: Medicare Other | Attending: Internal Medicine

## 2019-09-16 ENCOUNTER — Other Ambulatory Visit: Payer: Self-pay

## 2019-09-16 DIAGNOSIS — Z23 Encounter for immunization: Secondary | ICD-10-CM | POA: Insufficient documentation

## 2019-09-16 NOTE — Progress Notes (Signed)
   Covid-19 Vaccination Clinic  Name:  BETSI CRESPI    MRN: [id number] DOB: 1945/11/17  09/16/2019  Ms. Suriano was observed post Covid-19 immunization for 15 minutes without incidence. She was provided with Vaccine Information Sheet and instruction to access the V-Safe system.   Ms. Heinemann was instructed to call 911 with any severe reactions post vaccine: Marland Kitchen Difficulty breathing  . Swelling of your face and throat  . A fast heartbeat  . A bad rash all over your body  . Dizziness and weakness    Immunizations Administered    Name Date Dose VIS Date Route   Pfizer COVID-19 Vaccine 09/16/2019 12:00 PM 0.3 mL 08/05/2019 Intramuscular   Manufacturer: [employer], [employer]   Lot: [id number]   NDC: [address]-2

## 2019-10-07 ENCOUNTER — Ambulatory Visit: Payer: Medicare Other | Attending: Internal Medicine

## 2019-10-07 DIAGNOSIS — Z23 Encounter for immunization: Secondary | ICD-10-CM | POA: Insufficient documentation

## 2019-10-07 NOTE — Progress Notes (Signed)
   Covid-19 Vaccination Clinic  Name:  Susan Tucker    MRN: [id number] DOB: [birth year]  10/07/2019  [name] was observed post Covid-19 immunization for 15 minutes without incidence. She was provided with Vaccine Information Sheet and instruction to access the V-Safe system.   [name] was instructed to call 911 with any severe reactions post vaccine: Marland Kitchen Difficulty breathing  . Swelling of your face and throat  . A fast heartbeat  . A bad rash all over your body  . Dizziness and weakness    Immunizations Administered    Name Date Dose VIS Date Route   Pfizer COVID-19 Vaccine 10/07/2019  8:49 AM 0.3 mL 08/05/2019 Intramuscular   Manufacturer: [employer], [employer]   Lot: [id number]   NDC: [id number]

## 2021-11-28 NOTE — H&P (Signed)
TOTAL KNEE ADMISSION H&P ? ?Patient is being admitted for right total knee arthroplasty. ? ?Subjective: ? ?Chief Complaint: Right knee pain. ? ?HPI: Susan Tucker, 76 y.o. female has a history of pain and functional disability in the right knee due to arthritis and has failed non-surgical conservative treatments for greater than 12 weeks to include corticosteriod injections, viscosupplementation injections, and activity modification. Onset of symptoms was gradual, starting  several  years ago with gradually worsening course since that time. The patient noted prior procedures on the knee to include  arthroscopy and menisectomy on the right knee.  Patient currently rates pain in the right knee at 8 out of 10 with activity. Patient has night pain, worsening of pain with activity and weight bearing, pain with passive range of motion, and crepitus. Patient has evidence of  bone-on-bone arthritis in the medial and patellofemoral compartments with significant tibial subluxation  by imaging studies. There is no active infection. ? ?Patient Active Problem List  ? Diagnosis Date Noted  ? Hypertension 09/09/2012  ? Hyperlipidemia 09/09/2012  ? 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 Vitamin D deficiency disease 09/09/2012  ? Hip pain, bilateral 09/09/2012  ? 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 ? ?Past Medical History:  ?Diagnosis Date  ? Adult situational stress disorder historical  ? Allergic rhinitis historical  ? Chronic kidney disease   ? DJD (degenerative joint disease) historical  ? Hip pain, bilateral historical  ? Hx of right knee surgery historical  ? per patient  ? Hyperlipidemia historical  ? Hypertension   ? Renal insufficiency, mild historical  ? Sinus headache historical  ? Vitamin D deficiency historical  ? ? ?Past Surgical History:  ?Procedure Laterality Date  ? ABDOMINAL HYSTERECTOMY  12/1993  ? per patient  ? BACK SURGERY    ? ? ?Prior to Admission medications   ?Medication Sig  Start Date End Date Taking? Authorizing Provider  ?ALPRAZolam (XANAX) 0.5 MG tablet Take 0.5 mg by mouth at bedtime as needed for anxiety.    [provider]  ?Ascorbic Acid (VITAMIN C) 1000 MG tablet Take 1,000 mg by mouth daily.    [provider]  ?atorvastatin (LIPITOR) 10 MG tablet Take 10 mg by mouth daily.    [provider]  ?cholecalciferol (VITAMIN D) 1000 UNITS tablet Take 1,000 Units by mouth daily.    [provider]  ?cyclobenzaprine (FLEXERIL) 5 MG tablet Take 1 tablet (5 mg total) by mouth 3 (three) times daily. 05/28/16   Billy Fischer, MD  ?diclofenac (CATAFLAM) 50 MG tablet Take 1 tablet (50 mg total) by mouth 3 (three) times daily. 05/28/16   Billy Fischer, MD  ?Glucosamine-Chondroitin (OSTEO BI-FLEX REGULAR STRENGTH PO) Take by mouth daily.    [provider]  ?lansoprazole (PREVACID) 30 MG capsule Take 30 mg by mouth daily.    [provider]  ?magnesium oxide (MAG-OX) 400 MG tablet Take 400 mg by mouth 2 (two) times daily.    [provider]  ?Multiple Vitamin (MULTIVITAMIN) tablet Take 1 tablet by mouth daily.    [provider]  ?omeprazole (PRILOSEC) 40 MG capsule Take 40 mg by mouth daily.    [provider]  ?Probiotic Product (PROBIOTIC DAILY PO) Take 1 tablet by mouth daily.    [provider]  ?triamterene-hydrochlorothiazide (MAXZIDE-25) 37.5-25 MG per tablet Take 1 tablet by mouth daily.    [provider]  ? ? ?No Known Allergies ? ?Social History  ? ?  Socioeconomic History  ? Marital status: Widowed  ?  Spouse name: Not on file  ? Number of children: Not on file  ? Years of education: Not on file  ? Highest education level: Not on file  ?Occupational History  ? Occupation: English as a second language teacher [employer]  ?Tobacco Use  ? Smoking status: Never  ? Smokeless tobacco: Never  ?Substance and Sexual Activity  ? Alcohol use: No  ? Drug use: No  ? Sexual activity: Not on file  ?Other Topics Concern  ? Not on file   ?Social History Narrative  ? Not on file  ? ?Social Determinants of Health  ? ?Financial Resource Strain: Not on file  ?Food Insecurity: Not on file  ?Transportation Needs: Not on file  ?Physical Activity: Not on file  ?Stress: Not on file  ?Social Connections: Not on file  ?Intimate Partner Violence: Not on file  ? ? ?Tobacco Use: Not on file  ? ?Social History  ? ?Substance and Sexual Activity  ?Alcohol Use No  ? ? ?Family History  ?Problem Relation Age of Onset  ? Cancer Mother   ? Arthritis Father   ? Gout Father   ? Other Father   ?     expired at age 76 due to natural cause per patient  ? ? ?Review of Systems  ?Constitutional:  Negative for chills and fever.  ?HENT:  Negative for congestion, sore throat and tinnitus.   ?Eyes:  Negative for double vision, photophobia and pain.  ?Respiratory:  Negative for cough, shortness of breath and wheezing.   ?Cardiovascular:  Negative for chest pain, palpitations and orthopnea.  ?Gastrointestinal:  Negative for heartburn, nausea and vomiting.  ?Genitourinary:  Negative for dysuria, frequency and urgency.  ?Musculoskeletal:  Positive for joint pain.  ?Neurological:  Negative for dizziness, weakness and headaches.  ? ?Objective: ? ?Physical Exam: ?Well nourished and well developed.  ?General: Alert and oriented x3, cooperative and pleasant, no acute distress.  ?Head: normocephalic, atraumatic, neck supple.  ?Eyes: EOMI.  ?Musculoskeletal: ? ? Right Knee Exam:  ? Slight effusion present. No swelling present.  ? Slight varus deformity.  ? The range of motion is: 0 to 115 degrees.  ? Moderate crepitus on range of motion of the knee.  ? No medial joint line tenderness.  ? No lateral joint line tenderness.  ? Some pseudolaxity correcting back to neutral alignment.  ? No AP laxity.  ? ?Calves soft and nontender. Motor function intact in LE. Strength 5/5 LE bilaterally. ?Neuro: Distal pulses 2+. Sensation to light touch intact in LE. ? ?Imaging Review ?Plain radiographs  demonstrate severe degenerative joint disease of the right knee. The overall alignment is mild varus. The bone quality appears to be adequate for age and reported activity level. ? ?Assessment/Plan: ? ?End stage arthritis, right knee  ? ?The patient history, physical examination, clinical judgment of the provider and imaging studies are consistent with end stage degenerative joint disease of the right knee and total knee arthroplasty is deemed medically necessary. The treatment options including medical management, injection therapy arthroscopy and arthroplasty were discussed at length. The risks and benefits of total knee arthroplasty were presented and reviewed. The risks due to aseptic loosening, infection, stiffness, patella tracking problems, thromboembolic complications and other imponderables were discussed. The patient acknowledged the explanation, agreed to proceed with the plan and consent was signed. Patient is being admitted for inpatient treatment for surgery, pain control, PT, OT, prophylactic antibiotics, VTE prophylaxis, progressive ambulation and ADLs and discharge planning.  The patient is planning to be discharged  home . ? ? ?Patient's anticipated LOS is less than 2 midnights, meeting these requirements: ?- Lives within 1 hour of care ?- Has a competent adult at home to recover with post-op recover ?- NO history of ? - Chronic pain requiring opioids ? - Diabetes ? - Coronary Artery Disease ? - Heart failure ? - Heart attack ? - Stroke ? - DVT/VTE ? - Cardiac arrhythmia ? - Respiratory Failure/COPD ? - Renal failure ? - Anemia ? - Advanced Liver disease ? ?Therapy Plans: Outpatient therapy at [facility] ?Disposition: Home with son ?Planned DVT Prophylaxis: Aspirin 325 mg BID ?DME Needed: [facility] ?PCP: Kevan Ny, MD ?TXA: IV ?Allergies: NKDA ?Anesthesia Concerns: None ?BMI: 26.1 ?Last HgbA1c: Not diabetic.  ?Pharmacy: [facility] ([place]) ? ?Other: ?- Lives at home alone, will have her son come  stay a few nights following surgery. Has 7 steps at her house, may need to stay 2 nights prior to discharge.  ?- Appointment with Dr. Marlou Sa on 4/10 for medical clearance. Provided clearance form for patient to take

## 2021-12-04 ENCOUNTER — Other Ambulatory Visit: Payer: Self-pay | Admitting: Internal Medicine

## 2021-12-04 ENCOUNTER — Ambulatory Visit
Admission: RE | Admit: 2021-12-04 | Discharge: 2021-12-04 | Disposition: A | Discharge status: Home / Self Care | Payer: Medicare Other | Source: Ambulatory Visit | Attending: Internal Medicine | Admitting: Internal Medicine

## 2021-12-04 DIAGNOSIS — M546 Pain in thoracic spine: Secondary | ICD-10-CM

## 2021-12-09 NOTE — Patient Instructions (Addendum)
2 VISITORS ARE ALLOWED TO COME WITH YOU AND STAY IN THE WAITING ROOM ONLY DURING PRE OP AND PROCEDURE.   ? ?**NO VISITORS ARE ALLOWED IN THE SHORT STAY AREA OR RECOVERY ROOM!!** ? ?IF YOU WILL BE ADMITTED INTO THE HOSPITAL YOU ARE ALLOWED ONLY 4  SUPPORT PEOPLE DURING VISITATION HOURS ONLY (7 AM -8PM)   ?The support person(s) must pass our screening, gel in and out, and wear a mask at all times, including in the patient?s room. ?Patients must also wear a mask when staff or their support person are in the room. ?Visitors GUEST BADGE MUST BE WORN VISIBLY  ?One adult visitor may remain with you overnight and MUST be in the room by 8 P.M. ?  ? ? Your procedure is scheduled on: 12/23/21 ? ? Report to [facility] Main Entrance ? ?  Report to admitting at  7:45 AM ? ? Call this number if you have problems the morning of surgery [phone] ? ? Do not eat food :After Midnight. ? ? After Midnight you may have the following liquids until 7:30 AM DAY OF SURGERY ? ?Water ?Black Coffee (sugar ok, NO MILK/CREAM OR CREAMERS)  ?Tea (sugar ok, NO MILK/CREAM OR CREAMERS) regular and decaf                             ?Plain Jell-O (NO RED)                                           ?Fruit ices (not with fruit pulp, NO RED)                                     ?Popsicles (NO RED)                                                                  ?Juice: apple, WHITE grape, WHITE cranberry ?Sports drinks like Gatorade (NO RED) ?Clear broth(vegetable,chicken,beef) ? ?               ? ?  ?  ?The day of surgery:  ?Drink ONE (1) Pre-Surgery Clear Ensure  at 7:15 AM the morning of surgery. Drink in one sitting. Do not sip.  ?This drink was given to you during your hospital  ?pre-op appointment visit. ?Nothing else to drink after completing the  ?Pre-Surgery Clear Ensure at 7:30 AM ?  ?       If you have questions, please contact your surgeon?s office. ? ? ?  ?  ?Oral Hygiene is also important to reduce your risk of infection.                                     ?Remember - BRUSH YOUR TEETH THE MORNING OF SURGERY WITH YOUR REGULAR TOOTHPASTE ? ? Do NOT smoke after Midnight ? ? Take these medicines the morning of surgery with A SIP OF WATER: Omeprazole, Zyrtec if needed ? ? ?Bring  CPAP mask and tubing day of surgery. ?                  ?           You may not have any metal on your body including hair pins, jewelry, and body piercing ? ?           Do not wear make-up, lotions, powders, perfumes or deodorant ? ?Do not wear nail polish including gel and S&S, artificial/acrylic nails, or any other type of covering on natural nails including finger and toenails. If you have artificial nails, gel coating, etc. that needs to be removed by a nail salon please have this removed prior to surgery or surgery may need to be canceled/ delayed if the surgeon/ anesthesia feels like they are unable to be safely monitored.  ? ?Do not shave  48 hours prior to surgery.  ? ?           ? ? Do not bring valuables to the hospital. [facility] IS NOT ?            RESPONSIBLE   FOR VALUABLES. ? ? Contacts, dentures or bridgework may not be worn into surgery. ? ? Bring small overnight bag day of surgery. ?  ? Patients discharged on the day of surgery will not be allowed to drive home.  Someone NEEDS to stay with you for the first 24 hours after anesthesia. ? ? Special Instructions: Bring a copy of your healthcare power of attorney and living will documents the day of surgery if you haven't scanned them before. ? ?            Please read over the following fact sheets you were given: IF YOU HAVE QUESTIONS ABOUT YOUR PRE-OP INSTRUCTIONS PLEASE CALL [phone] ? ?   [facility] - Preparing for Surgery ?Before surgery, you can play an important role.  Because skin is not sterile, your skin needs to be as free of germs as possible.  You can reduce the number of germs on your skin by washing with CHG (chlorahexidine gluconate) soap before surgery.  CHG is an antiseptic cleaner  which kills germs and bonds with the skin to continue killing germs even after washing. ?Please DO NOT use if you have an allergy to CHG or antibacterial soaps.  If your skin becomes reddened/irritated stop using the CHG and inform your nurse when you arrive at Short Stay. ?Do not shave (including legs and underarms) for at least 48 hours prior to the first CHG shower.   ?Please follow these instructions carefully: ? 1.  Shower with CHG Soap the night before surgery and the  morning of Surgery. ? 2.  If you choose to wash your hair, wash your hair first as usual with your  normal  shampoo. ? 3.  After you shampoo, rinse your hair and body thoroughly to remove the  shampoo.                     ?       4.  Use CHG as you would any other liquid soap.  You can apply chg directly  to the skin and wash  ?                     Gently with a scrungie or clean washcloth. ? 5.  Apply the CHG Soap to your body ONLY FROM THE NECK DOWN.   Do not  use on face/ open      ?                     Wound or open sores. Avoid contact with eyes, ears mouth and genitals (private parts).  ?                     Engineering geologist,  Genitals (private parts) with your normal soap. ?            6.  Wash thoroughly, paying special attention to the area where your surgery  will be performed. ? 7.  Thoroughly rinse your body with warm water from the neck down. ? 8.  DO NOT shower/wash with your normal soap after using and rinsing off  the CHG Soap. ?               9.  Pat yourself dry with a clean towel. ?           10.  Wear clean pajamas. ?           11.  Place clean sheets on your bed the night of your first shower and do not  sleep with pets. ?Day of Surgery : ?Do not apply any lotions/deodorants the morning of surgery.  Please wear clean clothes to the hospital/surgery center. ? ?FAILURE TO FOLLOW THESE INSTRUCTIONS MAY RESULT IN THE CANCELLATION OF YOUR SURGERY ? ? ? ?Incentive Spirometer ? ?An incentive spirometer is a tool that can help keep your  lungs clear and active. This tool measures how well you are filling your lungs with each breath. Taking long deep breaths may help reverse or decrease the chance of developing breathing (pulmonary) problems (especially infection) following: ?A long period of time when you are unable to move or be active. ?BEFORE THE PROCEDURE  ?If the spirometer includes an indicator to show your best effort, your nurse or respiratory therapist will set it to a desired goal. ?If possible, sit up straight or lean slightly forward. Try not to slouch. ?Hold the incentive spirometer in an upright position. ?INSTRUCTIONS FOR USE  ?Sit on the edge of your bed if possible, or sit up as far as you can in bed or on a chair. ?Hold the incentive spirometer in an upright position. ?Breathe out normally. ?Place the mouthpiece in your mouth and seal your lips tightly around it. ?Breathe in slowly and as deeply as possible, raising the piston or the ball toward the top of the column. ?Hold your breath for 3-5 seconds or for as long as possible. Allow the piston or ball to fall to the bottom of the column. ?Remove the mouthpiece from your mouth and breathe out normally. ?Rest for a few seconds and repeat Steps 1 through 7 at least 10 times every 1-2 hours when you are awake. Take your time and take a few normal breaths between deep breaths. ?The spirometer may include an indicator to show your best effort. Use the indicator as a goal to work toward during each repetition. ?After each set of 10 deep breaths, practice coughing to be sure your lungs are clear. If you have an incision (the cut made at the time of surgery), support your incision when coughing by placing a pillow or rolled up towels firmly against it. ?Once you are able to get out of bed, walk around indoors and cough well. You may stop using the incentive spirometer when instructed by your caregiver.  ?RISKS AND  COMPLICATIONS ?Take your time so you do not get dizzy or light-headed. ?If  you are in pain, you may need to take or ask for pain medication before doing incentive spirometry. It is harder to take a deep breath if you are having pain. ?AFTER USE ?Rest and breathe slowly and easily.

## 2021-12-10 ENCOUNTER — Encounter (HOSPITAL_COMMUNITY): Payer: Self-pay

## 2021-12-10 ENCOUNTER — Other Ambulatory Visit: Payer: Self-pay

## 2021-12-10 ENCOUNTER — Encounter (HOSPITAL_COMMUNITY)
Admission: RE | Admit: 2021-12-10 | Discharge: 2021-12-10 | Disposition: A | Discharge status: Home / Self Care | Payer: Medicare Other | Source: Ambulatory Visit | Attending: Orthopedic Surgery | Admitting: Orthopedic Surgery

## 2021-12-10 VITALS — BP 138/75 | HR 82 | Temp 98.2°F | Resp 18 | Ht 62.0 in | Wt 153.0 lb

## 2021-12-10 DIAGNOSIS — M1711 Unilateral primary osteoarthritis, right knee: Secondary | ICD-10-CM | POA: Insufficient documentation

## 2021-12-10 DIAGNOSIS — Z01818 Encounter for other preprocedural examination: Secondary | ICD-10-CM | POA: Insufficient documentation

## 2021-12-10 LAB — COMPREHENSIVE METABOLIC PANEL
ALT: 26 U/L (ref 0–44)
AST: 29 U/L (ref 15–41)
Albumin: 4.3 g/dL (ref 3.5–5.0)
Alkaline Phosphatase: 69 U/L (ref 38–126)
Anion gap: 8 (ref 5–15)
BUN: 40 mg/dL — ABNORMAL HIGH (ref 8–23)
CO2: 24 mmol/L (ref 22–32)
Calcium: 9.6 mg/dL (ref 8.9–10.3)
Chloride: 105 mmol/L (ref 98–111)
Creatinine, Ser: 1.95 mg/dL — ABNORMAL HIGH (ref 0.44–1.00)
GFR, Estimated: 26 mL/min — ABNORMAL LOW (ref >60)
Glucose, Bld: 97 mg/dL (ref 70–99)
Potassium: 4.1 mmol/L (ref 3.5–5.1)
Sodium: 137 mmol/L (ref 135–145)
Total Bilirubin: 0.4 mg/dL (ref 0.3–1.2)
Total Protein: 7.6 g/dL (ref 6.5–8.1)

## 2021-12-10 LAB — CBC
HCT: 30.3 % — ABNORMAL LOW (ref 36.0–46.0)
Hemoglobin: 9.8 g/dL — ABNORMAL LOW (ref 12.0–15.0)
MCH: 23.8 pg — ABNORMAL LOW (ref 26.0–34.0)
MCHC: 32.3 g/dL (ref 30.0–36.0)
MCV: 73.5 fL — ABNORMAL LOW (ref 80.0–100.0)
Platelets: 308 10*3/uL (ref 150–400)
RBC: 4.12 MIL/uL (ref 3.87–5.11)
RDW: 17.4 % — ABNORMAL HIGH (ref 11.5–15.5)
WBC: 6 10*3/uL (ref 4.0–10.5)
nRBC: 0 % (ref 0.0–0.2)

## 2021-12-10 LAB — SURGICAL PCR SCREEN
MRSA, PCR: NEGATIVE
Staphylococcus aureus: NEGATIVE

## 2021-12-10 NOTE — Progress Notes (Signed)
Anesthesia note: ? ?Bowel prep reminder:NA ? ?PCP - Dr. Bary Richard ?Cardiologist -none ?Other-  ? ?Chest x-ray - NA ?EKG - 12/10/21-chart ?Stress Test - no ?ECHO - no ?Cardiac Cath - NA ? ?Pacemaker/ICD device last checked:NA ? ?Sleep Study - no ?CPAP -  ? ?Pt is pre diabetic-NA ?Fasting Blood Sugar -  ?Checks Blood Sugar _____ ? ?Blood Thinner:NA ?Blood Thinner Instructions: ?Aspirin Instructions: ?Last Dose: ? ?Anesthesia review: no ? ?Patient denies shortness of breath, fever, cough and chest pain at PAT appointment ?Pt has no SOB with any activities. ? ?Patient verbalized understanding of instructions that were given to them at the PAT appointment. Patient was also instructed that they will need to review over the PAT instructions again at home before surgery. yes ?

## 2021-12-19 ENCOUNTER — Encounter (HOSPITAL_COMMUNITY)
Admission: RE | Admit: 2021-12-19 | Discharge: 2021-12-19 | Disposition: A | Discharge status: Home / Self Care | Payer: Medicare Other | Source: Ambulatory Visit | Attending: Orthopedic Surgery | Admitting: Orthopedic Surgery

## 2021-12-20 NOTE — Anesthesia Preprocedure Evaluation (Addendum)
Anesthesia Evaluation  ?Patient identified by MRN, date of birth, ID band ?Patient awake ? ? ? ?Reviewed: ?Allergy & Precautions, NPO status , Patient's Chart, lab work & pertinent test results ? ?History of Anesthesia Complications ?Negative for: history of anesthetic complications ? ?Airway ?Mallampati: II ? ?TM Distance: >3 FB ?Neck ROM: Full ? ? ? Dental ? ?(+) Edentulous Upper, Partial Lower, Dental Advisory Given ?  ?Pulmonary ?neg pulmonary ROS, former smoker,  ?  ?Pulmonary exam normal ? ? ? ? ? ? ? Cardiovascular ?hypertension, Normal cardiovascular exam ? ? ?  ?Neuro/Psych ?PSYCHIATRIC DISORDERS Anxiety negative neurological ROS ?   ? GI/Hepatic ?negative GI ROS, Neg liver ROS,   ?Endo/Other  ?negative endocrine ROS ? Renal/GU ?Renal InsufficiencyRenal disease  ? ?  ?Musculoskeletal ? ?(+) Arthritis ,  ? Abdominal ?  ?Peds ? Hematology ? ?(+) Blood dyscrasia, anemia ,   ?Anesthesia Other Findings ? ? Reproductive/Obstetrics ? ?  ? ? ? ? ? ? ? ? ? ? ? ? ? ?  ?  ? ? ? ? ? ? ?Anesthesia Physical ?Anesthesia Plan ? ?ASA: 3 ? ?Anesthesia Plan: Spinal and MAC  ? ?Post-op Pain Management: Ofirmev IV (intra-op)* and Regional block*  ? ?Induction:  ? ?PONV Risk Score and Plan: 3 and Ondansetron, Dexamethasone, Midazolam and Propofol infusion ? ?Airway Management Planned: Natural Airway ? ?Additional Equipment:  ? ?Intra-op Plan:  ? ?Post-operative Plan:  ? ?Informed Consent: I have reviewed the patients History and Physical, chart, labs and discussed the procedure including the risks, benefits and alternatives for the proposed anesthesia with the patient or authorized representative who has indicated his/her understanding and acceptance.  ? ? ? ?Dental advisory given ? ?Plan Discussed with: Anesthesiologist and CRNA ? ?Anesthesia Plan Comments: (See PAT note 12/10/2021, Konrad Felix Ward, PA-C)  ? ? ? ? ?Anesthesia Quick Evaluation ? ?

## 2021-12-20 NOTE — Progress Notes (Signed)
Anesthesia Chart Review ? ? Case: [id number] Date/Time: 12/23/21 1015  ? Procedure: TOTAL KNEE ARTHROPLASTY (Right: Knee)  ? Anesthesia type: Choice  ? Pre-op diagnosis: right knee osteoarthritis  ? Location: [facility] ROOM 10 / [facility]  ? Surgeons: Gaynelle Arabian, MD  ? ?  ? ? ?DISCUSSION:76 y.o. former smoker with h/o HTN, CKD stage III, right knee OA scheduled for above procedure 12/23/21 ? ?Creatinine 1.95.  Discussed with PCP, Dr. Marlou Sa.  Creatinine has been between 1.65-1.8.  Advised hydration and holding Mobic.  Ok to proceed.  Recommended blood transfusion during admission due to hemoglobin of 9.8.  Forwarded to Dr. Wynelle Link.  ? ?Anticipate pt can proceed with planned procedure barring acute status change.   ?VS: BP 138/75   Pulse 82   Temp 36.8 ?C (Oral)   Resp 18   Ht 5\' 2"  (1.575 m)   Wt 69.4 kg   SpO2 100%   BMI 27.98 kg/m?  ? ?PROVIDERS: ?Rogers Blocker, MD is PCP  ? ? ?LABS: Labs reviewed: Acceptable for surgery. ?(all labs ordered are listed, but only abnormal results are displayed) ? ?Labs Reviewed  ?CBC - Abnormal; Notable for the following components:  ?    Result Value  ? Hemoglobin 9.8 (*)   ? HCT 30.3 (*)   ? MCV 73.5 (*)   ? MCH 23.8 (*)   ? RDW 17.4 (*)   ? All other components within normal limits  ?COMPREHENSIVE METABOLIC PANEL - Abnormal; Notable for the following components:  ? BUN 40 (*)   ? Creatinine, Ser 1.95 (*)   ? GFR, Estimated 26 (*)   ? All other components within normal limits  ?SURGICAL PCR SCREEN  ? ? ? ?IMAGES: ? ? ?EKG: ?12/10/2021 ?Rate 72 bpm  ?NSR ? ?CV: ? ?Past Medical History:  ?Diagnosis Date  ? Adult situational stress disorder historical  ? Allergic rhinitis historical  ? Chronic kidney disease   ? DJD (degenerative joint disease) historical  ? Hip pain, bilateral historical  ? Hx of right knee surgery historical  ? per patient  ? Hyperlipidemia historical  ? Hypertension   ? Renal insufficiency, mild historical  ? Sinus headache historical  ? Vitamin D deficiency historical   ? ? ?Past Surgical History:  ?Procedure Laterality Date  ? ABDOMINAL HYSTERECTOMY  12/1993  ? per patient  ? BACK SURGERY  2008  ? L4-L5 with screws  ? ? ?MEDICATIONS: ? ALPRAZolam (XANAX) 0.5 MG tablet  ? cetirizine (ZYRTEC) 10 MG tablet  ? Cholecalciferol (VITAMIN D) 125 MCG (5000 UT) CAPS  ? ezetimibe (ZETIA) 10 MG tablet  ? Glucosamine-Chondroitin (OSTEO BI-FLEX REGULAR STRENGTH PO)  ? Magnesium Oxide 250 MG TABS  ? meloxicam (MOBIC) 15 MG tablet  ? Multiple Vitamin (MULTIVITAMIN) tablet  ? omeprazole (PRILOSEC) 40 MG capsule  ? Probiotic Product (PROBIOTIC DAILY PO)  ? pyridoxine (B-6) 250 MG tablet  ? rosuvastatin (CRESTOR) 10 MG tablet  ? triamterene-hydrochlorothiazide (MAXZIDE-25) 37.5-25 MG per tablet  ? vitamin B-12 (CYANOCOBALAMIN) 500 MCG tablet  ? ?No current facility-administered medications for this encounter.  ? ? ? ?Konrad Felix Ward, PA-C ?[facility] ?[phone] ? ? ? ? ? ?

## 2021-12-23 ENCOUNTER — Ambulatory Visit (HOSPITAL_BASED_OUTPATIENT_CLINIC_OR_DEPARTMENT_OTHER): Payer: Medicare Other | Admitting: Anesthesiology

## 2021-12-23 ENCOUNTER — Encounter (HOSPITAL_COMMUNITY)
Admission: RE | Disposition: A | Discharge status: Home / Self Care | Payer: Self-pay | Source: Home / Self Care | Attending: Orthopedic Surgery

## 2021-12-23 ENCOUNTER — Encounter (HOSPITAL_COMMUNITY): Payer: Self-pay | Admitting: Orthopedic Surgery

## 2021-12-23 ENCOUNTER — Other Ambulatory Visit: Payer: Self-pay

## 2021-12-23 ENCOUNTER — Ambulatory Visit (HOSPITAL_COMMUNITY): Payer: Medicare Other | Admitting: Physician Assistant

## 2021-12-23 ENCOUNTER — Observation Stay (HOSPITAL_COMMUNITY)
Admission: RE | Admit: 2021-12-23 | Discharge: 2021-12-24 | Disposition: A | Discharge status: Home / Self Care | Payer: Medicare Other | Attending: Orthopedic Surgery | Admitting: Orthopedic Surgery

## 2021-12-23 DIAGNOSIS — M1711 Unilateral primary osteoarthritis, right knee: Secondary | ICD-10-CM

## 2021-12-23 DIAGNOSIS — F419 Anxiety disorder, unspecified: Secondary | ICD-10-CM

## 2021-12-23 DIAGNOSIS — N289 Disorder of kidney and ureter, unspecified: Secondary | ICD-10-CM | POA: Diagnosis not present

## 2021-12-23 DIAGNOSIS — I10 Essential (primary) hypertension: Secondary | ICD-10-CM | POA: Diagnosis not present

## 2021-12-23 DIAGNOSIS — Z79899 Other long term (current) drug therapy: Secondary | ICD-10-CM | POA: Insufficient documentation

## 2021-12-23 DIAGNOSIS — Z87891 Personal history of nicotine dependence: Secondary | ICD-10-CM

## 2021-12-23 DIAGNOSIS — N181 Chronic kidney disease, stage 1: Secondary | ICD-10-CM | POA: Diagnosis not present

## 2021-12-23 DIAGNOSIS — I129 Hypertensive chronic kidney disease with stage 1 through stage 4 chronic kidney disease, or unspecified chronic kidney disease: Secondary | ICD-10-CM | POA: Diagnosis not present

## 2021-12-23 DIAGNOSIS — M179 Osteoarthritis of knee, unspecified: Secondary | ICD-10-CM | POA: Diagnosis present

## 2021-12-23 HISTORY — PX: TOTAL KNEE ARTHROPLASTY: SHX125

## 2021-12-23 LAB — TYPE AND SCREEN
ABO/RH(D): B POS
Antibody Screen: NEGATIVE

## 2021-12-23 SURGERY — ARTHROPLASTY, KNEE, TOTAL
Anesthesia: Monitor Anesthesia Care | Site: Knee | Laterality: Right

## 2021-12-23 MED ORDER — DEXAMETHASONE SODIUM PHOSPHATE 10 MG/ML IJ SOLN
INTRAMUSCULAR | Status: DC | PRN
  Administered 2021-12-23: 5 mg via INTRAVENOUS

## 2021-12-23 MED ORDER — FENTANYL CITRATE (PF) 100 MCG/2ML IJ SOLN
INTRAMUSCULAR | Status: AC
  Filled 2021-12-23: qty 2

## 2021-12-23 MED ORDER — METOCLOPRAMIDE HCL 5 MG PO TABS: 5.0000 mg | ORAL_TABLET | Freq: Three times a day (TID) | ORAL | Status: DC | PRN

## 2021-12-23 MED ORDER — ONDANSETRON HCL 4 MG/2ML IJ SOLN
INTRAMUSCULAR | Status: DC | PRN
  Administered 2021-12-23: 4 mg via INTRAVENOUS

## 2021-12-23 MED ORDER — PHENYLEPHRINE HCL-NACL 20-0.9 MG/250ML-% IV SOLN
INTRAVENOUS | Status: DC | PRN
  Administered 2021-12-23: 30 ug/min via INTRAVENOUS

## 2021-12-23 MED ORDER — DEXAMETHASONE SODIUM PHOSPHATE 10 MG/ML IJ SOLN
8.0000 mg | Freq: Once | INTRAMUSCULAR | Status: AC
  Administered 2021-12-23: 8 mg via INTRAVENOUS

## 2021-12-23 MED ORDER — BUPIVACAINE LIPOSOME 1.3 % IJ SUSP
INTRAMUSCULAR | Status: DC | PRN
  Administered 2021-12-23: 20 mL

## 2021-12-23 MED ORDER — CEFAZOLIN SODIUM-DEXTROSE 2-4 GM/100ML-% IV SOLN: 2.0000 g | Freq: Four times a day (QID) | INTRAVENOUS | Status: DC

## 2021-12-23 MED ORDER — FLEET ENEMA 7-19 GM/118ML RE ENEM: 1.0000 | ENEMA | Freq: Once | RECTAL | Status: DC | PRN

## 2021-12-23 MED ORDER — BUPIVACAINE IN DEXTROSE 0.75-8.25 % IT SOLN
INTRATHECAL | Status: DC | PRN
  Administered 2021-12-23: 1.8 mL via INTRATHECAL

## 2021-12-23 MED ORDER — SODIUM CHLORIDE 0.9 % IR SOLN
Status: DC | PRN
  Administered 2021-12-23: 1000 mL

## 2021-12-23 MED ORDER — LACTATED RINGERS IV SOLN: INTRAVENOUS | Status: DC

## 2021-12-23 MED ORDER — ACETAMINOPHEN 500 MG PO TABS
1000.0000 mg | ORAL_TABLET | Freq: Four times a day (QID) | ORAL | Status: AC
  Administered 2021-12-23 – 2021-12-24 (×3): 1000 mg via ORAL
  Filled 2021-12-23 (×3): qty 2

## 2021-12-23 MED ORDER — BUPIVACAINE LIPOSOME 1.3 % IJ SUSP
INTRAMUSCULAR | Status: AC
  Filled 2021-12-23: qty 20

## 2021-12-23 MED ORDER — PROPOFOL 500 MG/50ML IV EMUL
INTRAVENOUS | Status: DC | PRN
  Administered 2021-12-23: 30 ug/kg/min via INTRAVENOUS

## 2021-12-23 MED ORDER — BISACODYL 10 MG RE SUPP: 10.0000 mg | Freq: Every day | RECTAL | Status: DC | PRN

## 2021-12-23 MED ORDER — SODIUM CHLORIDE (PF) 0.9 % IJ SOLN
INTRAMUSCULAR | Status: AC
  Filled 2021-12-23: qty 10

## 2021-12-23 MED ORDER — MIDAZOLAM HCL 5 MG/5ML IJ SOLN
INTRAMUSCULAR | Status: DC | PRN
  Administered 2021-12-23 (×2): 1 mg via INTRAVENOUS

## 2021-12-23 MED ORDER — DOCUSATE SODIUM 100 MG PO CAPS
100.0000 mg | ORAL_CAPSULE | Freq: Two times a day (BID) | ORAL | Status: DC
  Administered 2021-12-23 – 2021-12-24 (×2): 100 mg via ORAL
  Filled 2021-12-23 (×2): qty 1

## 2021-12-23 MED ORDER — PHENOL 1.4 % MT LIQD: 1.0000 | OROMUCOSAL | Status: DC | PRN

## 2021-12-23 MED ORDER — LIDOCAINE HCL (CARDIAC) PF 100 MG/5ML IV SOSY
PREFILLED_SYRINGE | INTRAVENOUS | Status: DC | PRN
Start: 2021-12-23 — End: 2021-12-23
  Administered 2021-12-23: 30 mg via INTRAVENOUS

## 2021-12-23 MED ORDER — FENTANYL CITRATE PF 50 MCG/ML IJ SOSY: 25.0000 ug | PREFILLED_SYRINGE | INTRAMUSCULAR | Status: DC | PRN

## 2021-12-23 MED ORDER — PROPOFOL 10 MG/ML IV BOLUS
INTRAVENOUS | Status: DC | PRN
  Administered 2021-12-23: 20 mg via INTRAVENOUS

## 2021-12-23 MED ORDER — ONDANSETRON HCL 4 MG/2ML IJ SOLN
INTRAMUSCULAR | Status: AC
  Filled 2021-12-23: qty 2

## 2021-12-23 MED ORDER — SODIUM CHLORIDE 0.9 % IV SOLN: INTRAVENOUS | Status: DC

## 2021-12-23 MED ORDER — ROPIVACAINE HCL 7.5 MG/ML IJ SOLN
INTRAMUSCULAR | Status: DC | PRN
  Administered 2021-12-23: 20 mL via PERINEURAL

## 2021-12-23 MED ORDER — MORPHINE SULFATE (PF) 2 MG/ML IV SOLN: 1.0000 mg | INTRAVENOUS | Status: DC | PRN

## 2021-12-23 MED ORDER — MENTHOL 3 MG MT LOZG: 1.0000 | LOZENGE | OROMUCOSAL | Status: DC | PRN

## 2021-12-23 MED ORDER — FENTANYL CITRATE PF 50 MCG/ML IJ SOSY
50.0000 ug | PREFILLED_SYRINGE | INTRAMUSCULAR | Status: DC
  Administered 2021-12-23: 100 ug via INTRAVENOUS
  Filled 2021-12-23: qty 2

## 2021-12-23 MED ORDER — ONDANSETRON HCL 4 MG PO TABS: 4.0000 mg | ORAL_TABLET | Freq: Four times a day (QID) | ORAL | Status: DC | PRN

## 2021-12-23 MED ORDER — CEFAZOLIN SODIUM-DEXTROSE 2-4 GM/100ML-% IV SOLN
2.0000 g | Freq: Three times a day (TID) | INTRAVENOUS | Status: AC
  Administered 2021-12-23 – 2021-12-24 (×2): 2 g via INTRAVENOUS
  Filled 2021-12-23 (×2): qty 100

## 2021-12-23 MED ORDER — POLYETHYLENE GLYCOL 3350 17 G PO PACK: 17.0000 g | PACK | Freq: Every day | ORAL | Status: DC | PRN

## 2021-12-23 MED ORDER — TRAMADOL HCL 50 MG PO TABS
50.0000 mg | ORAL_TABLET | Freq: Four times a day (QID) | ORAL | Status: DC | PRN
  Administered 2021-12-23: 50 mg via ORAL
  Administered 2021-12-23 – 2021-12-24 (×3): 100 mg via ORAL
  Filled 2021-12-23 (×2): qty 2
  Filled 2021-12-23: qty 1
  Filled 2021-12-23: qty 2

## 2021-12-23 MED ORDER — ACETAMINOPHEN 10 MG/ML IV SOLN
1000.0000 mg | Freq: Four times a day (QID) | INTRAVENOUS | Status: DC
  Administered 2021-12-23: 1000 mg via INTRAVENOUS
  Filled 2021-12-23: qty 100

## 2021-12-23 MED ORDER — DEXAMETHASONE SODIUM PHOSPHATE 10 MG/ML IJ SOLN
INTRAMUSCULAR | Status: AC
  Filled 2021-12-23: qty 1

## 2021-12-23 MED ORDER — OXYCODONE HCL 5 MG PO TABS: 5.0000 mg | ORAL_TABLET | ORAL | Status: DC | PRN

## 2021-12-23 MED ORDER — BUPIVACAINE LIPOSOME 1.3 % IJ SUSP: 20.0000 mL | Freq: Once | INTRAMUSCULAR | Status: DC

## 2021-12-23 MED ORDER — PROPOFOL 1000 MG/100ML IV EMUL
INTRAVENOUS | Status: AC
  Filled 2021-12-23: qty 100

## 2021-12-23 MED ORDER — POVIDONE-IODINE 10 % EX SWAB
2.0000 "application " | Freq: Once | CUTANEOUS | Status: AC
  Administered 2021-12-23: 2 via TOPICAL

## 2021-12-23 MED ORDER — SODIUM CHLORIDE (PF) 0.9 % IJ SOLN
INTRAMUSCULAR | Status: DC | PRN
  Administered 2021-12-23: 60 mL

## 2021-12-23 MED ORDER — ORAL CARE MOUTH RINSE: 15.0000 mL | Freq: Once | OROMUCOSAL | Status: AC

## 2021-12-23 MED ORDER — CEFAZOLIN SODIUM-DEXTROSE 2-4 GM/100ML-% IV SOLN
2.0000 g | INTRAVENOUS | Status: AC
  Administered 2021-12-23: 2 g via INTRAVENOUS
  Filled 2021-12-23: qty 100

## 2021-12-23 MED ORDER — ROSUVASTATIN CALCIUM 10 MG PO TABS: 10.0000 mg | ORAL_TABLET | Freq: Every day | ORAL | Status: DC

## 2021-12-23 MED ORDER — DEXAMETHASONE SODIUM PHOSPHATE 10 MG/ML IJ SOLN
10.0000 mg | Freq: Once | INTRAMUSCULAR | Status: AC
  Administered 2021-12-24: 10 mg via INTRAVENOUS
  Filled 2021-12-23: qty 1

## 2021-12-23 MED ORDER — EZETIMIBE 10 MG PO TABS
10.0000 mg | ORAL_TABLET | Freq: Every day | ORAL | Status: DC
  Administered 2021-12-24: 10 mg via ORAL
  Filled 2021-12-23: qty 1

## 2021-12-23 MED ORDER — METHOCARBAMOL 500 MG PO TABS
500.0000 mg | ORAL_TABLET | Freq: Four times a day (QID) | ORAL | Status: DC | PRN
  Administered 2021-12-23 – 2021-12-24 (×3): 500 mg via ORAL
  Filled 2021-12-23 (×3): qty 1

## 2021-12-23 MED ORDER — PANTOPRAZOLE SODIUM 40 MG PO TBEC
40.0000 mg | DELAYED_RELEASE_TABLET | Freq: Every day | ORAL | Status: DC
  Administered 2021-12-23 – 2021-12-24 (×2): 40 mg via ORAL
  Filled 2021-12-23 (×2): qty 1

## 2021-12-23 MED ORDER — METHOCARBAMOL 500 MG IVPB - SIMPLE MED
500.0000 mg | Freq: Four times a day (QID) | INTRAVENOUS | Status: DC | PRN
  Filled 2021-12-23: qty 50

## 2021-12-23 MED ORDER — METOCLOPRAMIDE HCL 5 MG/ML IJ SOLN: 5.0000 mg | Freq: Three times a day (TID) | INTRAMUSCULAR | Status: DC | PRN

## 2021-12-23 MED ORDER — CHLORHEXIDINE GLUCONATE 0.12 % MT SOLN
15.0000 mL | Freq: Once | OROMUCOSAL | Status: AC
  Administered 2021-12-23: 15 mL via OROMUCOSAL

## 2021-12-23 MED ORDER — DIPHENHYDRAMINE HCL 12.5 MG/5ML PO ELIX: 12.5000 mg | ORAL_SOLUTION | ORAL | Status: DC | PRN

## 2021-12-23 MED ORDER — FENTANYL CITRATE (PF) 100 MCG/2ML IJ SOLN
INTRAMUSCULAR | Status: DC | PRN
  Administered 2021-12-23: 25 ug via INTRAVENOUS
  Administered 2021-12-23: 50 ug via INTRAVENOUS
  Administered 2021-12-23: 25 ug via INTRAVENOUS

## 2021-12-23 MED ORDER — ONDANSETRON HCL 4 MG/2ML IJ SOLN: 4.0000 mg | Freq: Four times a day (QID) | INTRAMUSCULAR | Status: DC | PRN

## 2021-12-23 MED ORDER — AMISULPRIDE (ANTIEMETIC) 5 MG/2ML IV SOLN: 10.0000 mg | Freq: Once | INTRAVENOUS | Status: DC | PRN

## 2021-12-23 MED ORDER — ASPIRIN EC 325 MG PO TBEC
325.0000 mg | DELAYED_RELEASE_TABLET | Freq: Two times a day (BID) | ORAL | Status: DC
  Administered 2021-12-24: 325 mg via ORAL
  Filled 2021-12-23: qty 1

## 2021-12-23 MED ORDER — LIDOCAINE HCL (PF) 2 % IJ SOLN
INTRAMUSCULAR | Status: AC
  Filled 2021-12-23: qty 5

## 2021-12-23 MED ORDER — PHENYLEPHRINE HCL-NACL 20-0.9 MG/250ML-% IV SOLN: INTRAVENOUS | Status: DC | PRN

## 2021-12-23 MED ORDER — TRANEXAMIC ACID-NACL 1000-0.7 MG/100ML-% IV SOLN
1000.0000 mg | INTRAVENOUS | Status: AC
  Administered 2021-12-23: 1000 mg via INTRAVENOUS
  Filled 2021-12-23: qty 100

## 2021-12-23 MED ORDER — SODIUM CHLORIDE (PF) 0.9 % IJ SOLN
INTRAMUSCULAR | Status: AC
  Filled 2021-12-23: qty 50

## 2021-12-23 MED ORDER — MIDAZOLAM HCL 2 MG/2ML IJ SOLN
INTRAMUSCULAR | Status: AC
  Filled 2021-12-23: qty 2

## 2021-12-23 MED ORDER — TRIAMTERENE-HCTZ 37.5-25 MG PO TABS
1.0000 | ORAL_TABLET | Freq: Every day | ORAL | Status: DC
  Administered 2021-12-24: 1 via ORAL
  Filled 2021-12-23: qty 1

## 2021-12-23 SURGICAL SUPPLY — 58 items
ATTUNE MED DOME PAT 38 KNEE (Knees) ×1 IMPLANT
ATTUNE PS FEM RT SZ 4 CEM KNEE (Femur) ×1 IMPLANT
ATTUNE PSRP INSR SZ4 12 KNEE (Insert) ×1 IMPLANT
BAG COUNTER SPONGE SURGICOUNT (BAG) ×1 IMPLANT
BAG SPEC THK2 15X12 ZIP CLS (MISCELLANEOUS) ×1
BAG SPNG CNTER NS LX DISP (BAG) ×1
BAG ZIPLOCK 12X15 (MISCELLANEOUS) ×2 IMPLANT
BASEPLATE TIBIAL ROTATING SZ 4 (Knees) ×1 IMPLANT
BLADE SAG 18X100X1.27 (BLADE) ×2 IMPLANT
BLADE SAW SGTL 11.0X1.19X90.0M (BLADE) ×2 IMPLANT
BNDG ELASTIC 6X5.8 VLCR STR LF (GAUZE/BANDAGES/DRESSINGS) ×2 IMPLANT
BOWL SMART MIX CTS (DISPOSABLE) ×2 IMPLANT
BSPLAT TIB 4 CMNT ROT PLAT STR (Knees) ×1 IMPLANT
CEMENT HV SMART SET (Cement) ×4 IMPLANT
COVER SURGICAL LIGHT HANDLE (MISCELLANEOUS) ×2 IMPLANT
CUFF TOURN SGL QUICK 34 (TOURNIQUET CUFF) ×2
CUFF TRNQT CYL 34X4.125X (TOURNIQUET CUFF) ×1 IMPLANT
DRAPE INCISE IOBAN 66X45 STRL (DRAPES) ×2 IMPLANT
DRAPE U-SHAPE 47X51 STRL (DRAPES) ×2 IMPLANT
DRSG AQUACEL AG ADV 3.5X10 (GAUZE/BANDAGES/DRESSINGS) ×2 IMPLANT
DURAPREP 26ML APPLICATOR (WOUND CARE) ×2 IMPLANT
ELECT REM PT RETURN 15FT ADLT (MISCELLANEOUS) ×2 IMPLANT
GLOVE BIO SURGEON STRL SZ 6.5 (GLOVE) IMPLANT
GLOVE BIO SURGEON STRL SZ7.5 (GLOVE) IMPLANT
GLOVE BIO SURGEON STRL SZ8 (GLOVE) ×2 IMPLANT
GLOVE BIOGEL PI IND STRL 6.5 (GLOVE) IMPLANT
GLOVE BIOGEL PI IND STRL 7.0 (GLOVE) IMPLANT
GLOVE BIOGEL PI IND STRL 8 (GLOVE) ×1 IMPLANT
GLOVE BIOGEL PI INDICATOR 6.5 (GLOVE)
GLOVE BIOGEL PI INDICATOR 7.0 (GLOVE)
GLOVE BIOGEL PI INDICATOR 8 (GLOVE) ×1
GOWN STRL REUS W/ TWL LRG LVL3 (GOWN DISPOSABLE) ×1 IMPLANT
GOWN STRL REUS W/ TWL XL LVL3 (GOWN DISPOSABLE) IMPLANT
GOWN STRL REUS W/TWL LRG LVL3 (GOWN DISPOSABLE) ×2
GOWN STRL REUS W/TWL XL LVL3 (GOWN DISPOSABLE)
HANDPIECE INTERPULSE COAX TIP (DISPOSABLE) ×2
HOLDER FOLEY CATH W/STRAP (MISCELLANEOUS) ×1 IMPLANT
IMMOBILIZER KNEE 20 (SOFTGOODS) ×2
IMMOBILIZER KNEE 20 THIGH 36 (SOFTGOODS) ×1 IMPLANT
KIT TURNOVER KIT A (KITS) IMPLANT
MANIFOLD NEPTUNE II (INSTRUMENTS) ×2 IMPLANT
NS IRRIG 1000ML POUR BTL (IV SOLUTION) ×2 IMPLANT
PACK TOTAL KNEE CUSTOM (KITS) ×2 IMPLANT
PADDING CAST COTTON 6X4 STRL (CAST SUPPLIES) ×3 IMPLANT
PROTECTOR NERVE ULNAR (MISCELLANEOUS) ×2 IMPLANT
SET HNDPC FAN SPRY TIP SCT (DISPOSABLE) ×1 IMPLANT
SLEEVE SCD COMPRESS KNEE MED (STOCKING) ×1 IMPLANT
SPIKE FLUID TRANSFER (MISCELLANEOUS) ×1 IMPLANT
STRIP CLOSURE SKIN 1/2X4 (GAUZE/BANDAGES/DRESSINGS) ×4 IMPLANT
SUT MNCRL AB 4-0 PS2 18 (SUTURE) ×2 IMPLANT
SUT STRATAFIX 0 PDS 27 VIOLET (SUTURE) ×2
SUT VIC AB 2-0 CT1 27 (SUTURE) ×6
SUT VIC AB 2-0 CT1 TAPERPNT 27 (SUTURE) ×3 IMPLANT
SUTURE STRATFX 0 PDS 27 VIOLET (SUTURE) ×1 IMPLANT
TRAY FOLEY MTR SLVR 16FR STAT (SET/KITS/TRAYS/PACK) ×2 IMPLANT
TUBE SUCTION HIGH CAP CLEAR NV (SUCTIONS) ×2 IMPLANT
WATER STERILE IRR 1000ML POUR (IV SOLUTION) ×4 IMPLANT
WRAP KNEE MAXI GEL POST OP (GAUZE/BANDAGES/DRESSINGS) ×2 IMPLANT

## 2021-12-23 NOTE — Transfer of Care (Signed)
Immediate Anesthesia Transfer of Care Note ? ?Patient: Susan Tucker ? ?Procedure(s) Performed: TOTAL KNEE ARTHROPLASTY (Right: Knee) ? ?Patient Location: PACU ? ?Anesthesia Type:Spinal ? ?Level of Consciousness: awake and alert  ? ?Airway & Oxygen Therapy: Patient Spontanous Breathing and Patient connected to face mask oxygen ? ?Post-op Assessment: Report given to RN and Post -op Vital signs reviewed and stable ? ?Post vital signs: Reviewed and stable ? ?Last Vitals:  ?Vitals Value Taken Time  ?BP 106/92   ?Temp    ?Pulse 67   ?Resp    ?SpO2 100   ? ? ?Last Pain:  ?Vitals:  ? 12/23/21 1000  ?TempSrc:   ?PainSc: 0-No pain  ?   ? ?Patients Stated Pain Goal: 4 (12/23/21 0815) ? ?Complications: No notable events documented. ?

## 2021-12-23 NOTE — Progress Notes (Signed)
Assisted Dr. Singer with right, adductor canal, ultrasound guided block. Side rails up, monitors on throughout procedure. See vital signs in flow sheet. Tolerated Procedure well.  

## 2021-12-23 NOTE — Anesthesia Procedure Notes (Addendum)
Spinal ? ?Patient location during procedure: OR ?Start time: 12/23/2021 10:52 AM ?End time: 12/23/2021 11:04 AM ?Reason for block: surgical anesthesia ?Staffing ?Performed: anesthesiologist  ?Anesthesiologist: Heather Roberts, MD ?Preanesthetic Checklist ?Completed: patient identified, IV checked, risks and benefits discussed, surgical consent, monitors and equipment checked, pre-op evaluation and timeout performed ?Spinal Block ?Patient position: sitting ?Prep: DuraPrep ?Patient monitoring: cardiac monitor, continuous pulse ox and blood pressure ?Approach: midline ?Location: L2-3 ?Injection technique: single-shot ?Needle ?Needle type: Pencan  ?Needle gauge: 24 G ?Needle length: 9 cm ?Assessment ?Events: CSF return and second provider ?Additional Notes ?Functioning IV was confirmed and monitors were applied. Sterile prep and drape, including hand hygiene and sterile gloves were used. The patient was positioned and the spine was prepped. The skin was anesthetized with lidocaine.  Free flow of clear CSF was obtained prior to injecting local anesthetic into the CSF.  The spinal needle aspirated freely following injection.  The needle was carefully withdrawn.  The patient tolerated the procedure well.  ? ? ? ?

## 2021-12-23 NOTE — Interval H&P Note (Signed)
History and Physical Interval Note: ? ?12/23/2021 ?8:47 AM ? ?Susan Tucker  has presented today for surgery, with the diagnosis of right knee osteoarthritis.  The various methods of treatment have been discussed with the patient and family. After consideration of risks, benefits and other options for treatment, the patient has consented to  Procedure(s): ?TOTAL KNEE ARTHROPLASTY (Right) as a surgical intervention.  The patient's history has been reviewed, patient examined, no change in status, stable for surgery.  I have reviewed the patient's chart and labs.  Questions were answered to the patient's satisfaction.   ? ? ?[name] ? ? ?

## 2021-12-23 NOTE — Progress Notes (Signed)
Orthopedic Tech Progress Note ?Patient Details:  ?Susan Tucker ?[birth year] ?[id number] ? ?CPM Right Knee ?CPM Right Knee: On ?Right Knee Flexion (Degrees): 40 ?Right Knee Extension (Degrees): 10 ? ?Post Interventions ?Patient Tolerated: Well ? ?Setareh Rom E Gabriella Guile ?12/23/2021, 2:28 PM ? ?

## 2021-12-23 NOTE — Op Note (Signed)
OPERATIVE REPORT-TOTAL KNEE ARTHROPLASTY ? ? ?Pre-operative diagnosis- Osteoarthritis  Right knee(s) ? ?Post-operative diagnosis- Osteoarthritis Right knee(s) ? ?Procedure-  Right  Total Knee Arthroplasty ? ?Surgeon- Dione Plover. Judah Carchi, MD ? ?Assistant- Jaynie Bream, PA-C  ? ?Anesthesia-   Adductor canal block and spinal ? ?EBL- 25 ml ?  ?Drains None ? ?Tourniquet time-  ?Total Tourniquet Time Documented: ?Thigh (Right) - 34 minutes ?Total: Thigh (Right) - 34 minutes ?   ? ?Complications- None ? ?Condition-PACU - hemodynamically stable.  ? ?Brief Clinical Note  Susan Tucker is a 76 y.o. year old female with end stage OA of her right knee with progressively worsening pain and dysfunction. She has constant pain, with activity and at rest and significant functional deficits with difficulties even with ADLs. She has had extensive non-op management including analgesics, injections of cortisone and viscosupplements, and home exercise program, but remains in significant pain with significant dysfunction.Radiographs show bone on bone arthritis medial and patellofemoral. She presents now for right Total Knee Arthroplasty.    ? ?Procedure in detail--- ? ? The patient is brought into the operating room and positioned supine on the operating table. After successful administration of  Adductor canal block and spinal,   a tourniquet is placed high on the  Right thigh(s) and the lower extremity is prepped and draped in the usual sterile fashion. Time out is performed by the operating team and then the  Right lower extremity is wrapped in Esmarch, knee flexed and the tourniquet inflated to 300 mmHg.  ?     A midline incision is made with a ten blade through the subcutaneous tissue to the level of the extensor mechanism. A fresh blade is used to make a medial parapatellar arthrotomy. Soft tissue over the proximal medial tibia is subperiosteally elevated to the joint line with a knife and into the semimembranosus bursa with  a Cobb elevator. Soft tissue over the proximal lateral tibia is elevated with attention being paid to avoiding the patellar tendon on the tibial tubercle. The patella is everted, knee flexed 90 degrees and the ACL and PCL are removed. Findings are bone on bone medial and  patellofemoral with large global osteophytes.   ?     The drill is used to create a starting hole in the distal femur and the canal is thoroughly irrigated with sterile saline to remove the fatty contents. The 5 degree Right  valgus alignment guide is placed into the femoral canal and the distal femoral cutting block is pinned to remove 9 mm off the distal femur. Resection is made with an oscillating saw. ?     The tibia is subluxed forward and the menisci are removed. The extramedullary alignment guide is placed referencing proximally at the medial aspect of the tibial tubercle and distally along the second metatarsal axis and tibial crest. The block is pinned to remove 91mm off the more deficient medial  side. Resection is made with an oscillating saw. Size 4is the most appropriate size for the tibia and the proximal tibia is prepared with the modular drill and keel punch for that size. ?     The femoral sizing guide is placed and size 4 is most appropriate. Rotation is marked off the epicondylar axis and confirmed by creating a rectangular flexion gap at 90 degrees. The size 4 cutting block is pinned in this rotation and the anterior, posterior and chamfer cuts are made with the oscillating saw. The intercondylar block is then placed and that cut  is made. ?     Trial size 4 tibial component, trial size 4 posterior stabilized femur and a 12  mm posterior stabilized rotating platform insert trial is placed. Full extension is achieved with excellent varus/valgus and anterior/posterior balance throughout full range of motion. The patella is everted and thickness measured to be 24  mm. Free hand resection is taken to 14 mm, a 38 template is placed,  lug holes are drilled, trial patella is placed, and it tracks normally. Osteophytes are removed off the posterior femur with the trial in place. All trials are removed and the cut bone surfaces prepared with pulsatile lavage. Cement is mixed and once ready for implantation, the size 4 tibial implant, size  4 posterior stabilized femoral component, and the size 38 patella are cemented in place and the patella is held with the clamp. The trial insert is placed and the knee held in full extension. The Exparel (20 ml mixed with 60 ml saline) is injected into the extensor mechanism, posterior capsule, medial and lateral gutters and subcutaneous tissues.  All extruded cement is removed and once the cement is hard the permanent 12 mm posterior stabilized rotating platform insert is placed into the tibial tray. ?     The wound is copiously irrigated with saline solution and the extensor mechanism closed with # 0 Stratofix suture. The tourniquet is released for a total tourniquet time of 34  minutes. Flexion against gravity is 140 degrees and the patella tracks normally. Subcutaneous tissue is closed with 2.0 vicryl and subcuticular with running 4.0 Monocryl. The incision is cleaned and dried and steri-strips and a bulky sterile dressing are applied. The limb is placed into a knee immobilizer and the patient is awakened and transported to recovery in stable condition. ?     Please note that a surgical assistant was a medical necessity for this procedure in order to perform it in a safe and expeditious manner. Surgical assistant was necessary to retract the ligaments and vital neurovascular structures to prevent injury to them and also necessary for proper positioning of the limb to allow for anatomic placement of the prosthesis. ? ? ?Dione Plover Abdulhamid Olgin, MD ? ? ? ?12/23/2021, 12:00 PM ? ? ?

## 2021-12-23 NOTE — Anesthesia Postprocedure Evaluation (Signed)
Anesthesia Post Note ? ?Patient: Susan Tucker ? ?Procedure(s) Performed: TOTAL KNEE ARTHROPLASTY (Right: Knee) ? ?  ? ?Patient location during evaluation: PACU ?Anesthesia Type: MAC ?Level of consciousness: awake and alert ?Pain management: pain level controlled ?Vital Signs Assessment: post-procedure vital signs reviewed and stable ?Respiratory status: spontaneous breathing and respiratory function stable ?Cardiovascular status: blood pressure returned to baseline and stable ?Postop Assessment: spinal receding ?Anesthetic complications: no ? ? ?No notable events documented. ? ?Last Vitals:  ?Vitals:  ? 12/23/21 1319 12/23/21 1330  ?BP:  130/66  ?Pulse: 92 71  ?Resp: 14 12  ?Temp:  36.6 ?C  ?SpO2: 95% 95%  ?  ?Last Pain:  ?Vitals:  ? 12/23/21 1330  ?TempSrc:   ?PainSc: 0-No pain  ? ? ?LLE Motor Response: Purposeful movement (12/23/21 1330) ?LLE Sensation: Numbness;Decreased (12/23/21 1330) ?RLE Motor Response: Purposeful movement (12/23/21 1330) ?RLE Sensation: Decreased;Numbness (12/23/21 1330) ?L Sensory Level: S1-Sole of foot, small toes (12/23/21 1330) ?R Sensory Level: S1-Sole of foot, small toes (12/23/21 1330) ? ?Rehmat Murtagh DANIEL ? ? ? ? ?

## 2021-12-23 NOTE — Anesthesia Procedure Notes (Signed)
Anesthesia Regional Block: Adductor canal block  ? ?Pre-Anesthetic Checklist: , timeout performed,  Correct Patient, Correct Site, Correct Laterality,  Correct Procedure, Correct Position, site marked,  Risks and benefits discussed,  Surgical consent,  Pre-op evaluation,  At surgeon's request and post-op pain management ? ?Laterality: Right ? ?Prep: chloraprep     ?  ?Needles:  ?Injection technique: Single-shot ? ?Needle Type: Stimulator Needle - 80   ? ? ?Needle Length: 10cm  ?Needle Gauge: 21  ? ? ? ?Additional Needles: ? ? ?Narrative:  ?Start time: 12/23/2021 9:48 AM ?End time: 12/23/2021 9:58 AM ?Injection made incrementally with aspirations every 5 mL. ? ?Performed by: Personally  ?Anesthesiologist: Heather Roberts, MD ? ? ? ? ?

## 2021-12-23 NOTE — Evaluation (Signed)
Physical Therapy Evaluation ?Patient Details ?Name: Susan Tucker ?MRN: [id number] ?DOB: 01-17-1946 ?Today's Date: 12/23/2021 ? ?History of Present Illness ? Pt is a 76yo female presenting s/p R-TKA on 12/23/21. PMH: CKD, HLD, HTN, L4-L5 back surgery  ?Clinical Impression ? BAY JARQUIN is a 76 y.o. female POD 0 s/p R-TKA. Patient reports independence with mobility at baseline. Patient is now limited by functional impairments (see PT problem list below) and requires min guard for transfers and gait with RW. Patient was able to ambulate 25 feet with RW and min guard assist. Patient instructed in exercise to facilitate ROM and circulation to manage edema. Patient will benefit from continued skilled PT interventions to address impairments and progress towards PLOF. Acute PT will follow to progress mobility and stair training in preparation for safe discharge home.   ?   ? ?Recommendations for follow up therapy are one component of a multi-disciplinary discharge planning process, led by the attending physician.  Recommendations may be updated based on patient status, additional functional criteria and insurance authorization. ? ?Follow Up Recommendations Follow physician's recommendations for discharge plan and follow up therapies ? ?  ?Assistance Recommended at Discharge Set up Supervision/Assistance  ?Patient can return home with the following ? A little help with walking and/or transfers;A little help with bathing/dressing/bathroom;Assistance with cooking/housework;Assist for transportation;Help with stairs or ramp for entrance ? ?  ?Equipment Recommendations Rolling walker (2 wheels)  ?Recommendations for Other Services ?    ?  ?Functional Status Assessment Patient has had a recent decline in their functional status and/or demonstrates limited ability to make significant improvements in function in a reasonable and predictable amount of time  ? ?  ?Precautions / Restrictions Precautions ?Precautions:  Fall ?Restrictions ?Weight Bearing Restrictions: No ?Other Position/Activity Restrictions: WBAT  ? ?  ? ?Mobility ? Bed Mobility ?Overal bed mobility: Needs Assistance ?Bed Mobility: Supine to Sit ?  ?  ?Supine to sit: Min assist ?  ?  ?General bed mobility comments: Pt min assist to bring RLE off bed, otherwise min guard. ?  ? ?Transfers ?Overall transfer level: Needs assistance ?Equipment used: Rolling walker (2 wheels) ?Transfers: Sit to/from Stand ?Sit to Stand: Min guard ?  ?  ?  ?  ?  ?General transfer comment: Pt min guard for transfer, no physical assist required, VCs for sequencing and hand placement. ?  ? ?Ambulation/Gait ?Ambulation/Gait assistance: +2 safety/equipment, Min guard ?Gait Distance (Feet): 25 Feet ?Assistive device: Rolling walker (2 wheels) ?Gait Pattern/deviations: Decreased step length - left, Decreased stance time - right, Decreased stride length ?Gait velocity: decreased ?  ?  ?General Gait Details: Pt ambulated in hallway with RW and min guard, no physical assist required, +2 recliner follow for safety only. No overt LOB noted. ? ?Stairs ?  ?  ?  ?  ?  ? ?Wheelchair Mobility ?  ? ?Modified Rankin (Stroke Patients Only) ?  ? ?  ? ?Balance Overall balance assessment: Needs assistance ?Sitting-balance support: Feet supported, No upper extremity supported ?Sitting balance-Leahy Scale: Good ?  ?  ?Standing balance support: Reliant on assistive device for balance, During functional activity, Bilateral upper extremity supported ?Standing balance-Leahy Scale: Poor ?  ?  ?  ?  ?  ?  ?  ?  ?  ?  ?  ?  ?   ? ? ? ?Pertinent Vitals/Pain Pain Assessment ?Pain Assessment: 0-10 ?Pain Score: 4  ?Pain Location: right knee, low back ?Pain Descriptors / Indicators: Operative site guarding, Discomfort  ? ? ?  Home Living Family/patient expects to be discharged to:: Private residence ?Living Arrangements: Alone ?Available Help at Discharge: Family;Available 24 hours/day (Son Casimiro Needle for 3 weeks) ?Type of Home:  House ?Home Access: Stairs to enter ?Entrance Stairs-Rails: None ?Entrance Stairs-Number of Steps: 2 ?Alternate Level Stairs-Number of Steps: 7 ?Home Layout: Bed/bath upstairs;Two level ?Home Equipment: None ?Additional Comments: pt reports no ability to stay on first floor so discussed pacing of day (only going down and up 1x each).  ?  ?Prior Function Prior Level of Function : Independent/Modified Independent ?  ?  ?  ?  ?  ?  ?Mobility Comments: ind ?ADLs Comments: ind ?  ? ? ?Hand Dominance  ?   ? ?  ?Extremity/Trunk Assessment  ? Upper Extremity Assessment ?Upper Extremity Assessment: Overall WFL for tasks assessed ?  ? ?Lower Extremity Assessment ?Lower Extremity Assessment: RLE deficits/detail;LLE deficits/detail ?RLE Deficits / Details: MMT ank pf/df 5/5, no extensor lag noted ?RLE Sensation: WNL ?LLE Deficits / Details: MMT ank df/pf 5/5 ?LLE Sensation: WNL ?  ? ?Cervical / Trunk Assessment ?Cervical / Trunk Assessment: Back Surgery  ?Communication  ? Communication: No difficulties  ?Cognition Arousal/Alertness: Awake/alert ?Behavior During Therapy: [facility] for tasks assessed/performed ?Overall Cognitive Status: Within Functional Limits for tasks assessed ?  ?  ?  ?  ?  ?  ?  ?  ?  ?  ?  ?  ?  ?  ?  ?  ?  ?  ?  ? ?  ?General Comments   ? ?  ?Exercises Total Joint Exercises ?Ankle Circles/Pumps: AROM, 20 reps, Both  ? ?Assessment/Plan  ?  ?PT Assessment Patient needs continued PT services  ?PT Problem List Decreased strength;Decreased range of motion;Decreased activity tolerance;Decreased balance;Decreased mobility;Decreased coordination;Pain ? ?   ?  ?PT Treatment Interventions DME instruction;Gait training;Stair training;Functional mobility training;Therapeutic activities;Therapeutic exercise;Balance training;Neuromuscular re-education;Patient/family education   ? ?PT Goals (Current goals can be found in the Care Plan section)  ?Acute Rehab PT Goals ?Patient Stated Goal: to walk up and down stairs ?PT Goal  Formulation: With patient ?Time For Goal Achievement: 12/30/21 ?Potential to Achieve Goals: Good ? ?  ?Frequency 7X/week ?  ? ? ?Co-evaluation   ?  ?  ?  ?  ? ? ?  ?AM-PAC PT "6 Clicks" Mobility  ?Outcome Measure Help needed turning from your back to your side while in a flat bed without using bedrails?: None ?Help needed moving from lying on your back to sitting on the side of a flat bed without using bedrails?: A Little ?Help needed moving to and from a bed to a chair (including a wheelchair)?: A Little ?Help needed standing up from a chair using your arms (e.g., wheelchair or bedside chair)?: A Little ?Help needed to walk in hospital room?: A Little ?Help needed climbing 3-5 steps with a railing? : A Lot ?6 Click Score: 18 ? ?  ?End of Session Equipment Utilized During Treatment: Gait belt ?Activity Tolerance: Patient tolerated treatment well ?Patient left: in chair;with call bell/phone within reach;with chair alarm set;with SCD's reapplied;with family/visitor present ?Nurse Communication: Mobility status ?PT Visit Diagnosis: Difficulty in walking, not elsewhere classified (R26.2) ?  ? ?Time: 2426-8341 ?PT Time Calculation (min) (ACUTE ONLY): 17 min ? ? ?Charges:   PT Evaluation ?$PT Eval Low Complexity: 1 Low ?  ?  ?   ? ? ?Jamesetta Geralds, PT, DPT ?[facility] Department ?Office: [phone] ?Pager: [phone] ? ?Jamesetta Geralds ?12/23/2021, 7:47 PM ? ?

## 2021-12-23 NOTE — Discharge Instructions (Signed)
? ?Frank Aluisio, MD ?[facility] Specialist ?[facility] ?[address] ?[place], [place] [address] ?[phone] ? ?TOTAL KNEE REPLACEMENT POSTOPERATIVE DIRECTIONS ? ? ? ?Knee Rehabilitation, Guidelines Following Surgery  ?Results after knee surgery are often greatly improved when you follow the exercise, range of motion and muscle strengthening exercises prescribed by your doctor. Safety measures are also important to protect the knee from further injury. If any of these exercises cause you to have increased pain or swelling in your knee joint, decrease the amount until you are comfortable again and slowly increase them. If you have problems or questions, call your caregiver or physical therapist for advice.  ? ?BLOOD CLOT PREVENTION ?Take a 325 mg Aspirin two times a day for three weeks following surgery. Then take an 81 mg Aspirin once a day for three weeks. Then discontinue Aspirin. ?You may resume your vitamins/supplements upon discharge from the hospital. ?Do not take any NSAIDs (Advil, Aleve, Ibuprofen, Meloxicam, etc.) until you have discontinued the 325 mg Aspirin.  ? ?HOME CARE INSTRUCTIONS  ?Remove items at home which could result in a fall. This includes throw rugs or furniture in walking pathways.  ?ICE to the affected knee as much as tolerated. Icing helps control swelling. If the swelling is well controlled you will be more comfortable and rehab easier. Continue to use ice on the knee for pain and swelling from surgery. You may notice swelling that will progress down to the foot and ankle. This is normal after surgery. Elevate the leg when you are not up walking on it.    ?Continue to use the breathing machine which will help keep your temperature down. It is common for your temperature to cycle up and down following surgery, especially at night when you are not up moving around and exerting yourself. The breathing machine keeps your lungs expanded and your temperature  down. ?Do not place pillow under the operative knee, focus on keeping the knee straight while resting ? ?DIET ?You may resume your previous home diet once you are discharged from the hospital. ? ?DRESSING / WOUND CARE / SHOWERING ?Keep your bulky bandage on for 2 days. On the third post-operative day you may remove the Ace bandage and gauze. There is a waterproof adhesive bandage on your skin which will stay in place until your first follow-up appointment. Once you remove this you will not need to place another bandage ?You may begin showering 3 days following surgery, but do not submerge the incision under water. ? ?ACTIVITY ?For the first 5 days, the key is rest and control of pain and swelling ?Do your home exercises twice a day starting on post-operative day 3. On the days you go to physical therapy, just do the home exercises once that day. ?You should rest, ice and elevate the leg for 50 minutes out of every hour. Get up and walk/stretch for 10 minutes per hour. After 5 days you can increase your activity slowly as tolerated. ?Walk with your walker as instructed. Use the walker until you are comfortable transitioning to a cane. Walk with the cane in the opposite hand of the operative leg. You may discontinue the cane once you are comfortable and walking steadily. ?Avoid periods of inactivity such as sitting longer than an hour when not asleep. This helps prevent blood clots.  ?You may discontinue the knee immobilizer once you are able to perform a straight leg raise while lying down. ?You may resume a sexual relationship in one   month or when given the OK by your doctor.  ?You may return to work once you are cleared by your doctor.  ?Do not drive a car for 6 weeks or until released by your surgeon.  ?Do not drive while taking narcotics. ? ?TED HOSE STOCKINGS ?Wear the elastic stockings on both legs for three weeks following surgery during the day. You may remove them at night for sleeping. ? ?WEIGHT  BEARING ?Weight bearing as tolerated with assist device (walker, cane, etc) as directed, use it as long as suggested by your surgeon or therapist, typically at least 4-6 weeks. ? ?POSTOPERATIVE CONSTIPATION PROTOCOL ?Constipation - defined medically as fewer than three stools per week and severe constipation as less than one stool per week. ? ?One of the most common issues patients have following surgery is constipation.  Even if you have a regular bowel pattern at home, your normal regimen is likely to be disrupted due to multiple reasons following surgery.  Combination of anesthesia, postoperative narcotics, change in appetite and fluid intake all can affect your bowels.  In order to avoid complications following surgery, here are some recommendations in order to help you during your recovery period. ? ?Colace (docusate) - Pick up an over-the-counter form of Colace or another stool softener and take twice a day as long as you are requiring postoperative pain medications.  Take with a full glass of water daily.  If you experience loose stools or diarrhea, hold the colace until you stool forms back up. If your symptoms do not get better within 1 week or if they get worse, check with your doctor. ?Dulcolax (bisacodyl) - Pick up over-the-counter and take as directed by the product packaging as needed to assist with the movement of your bowels.  Take with a full glass of water.  Use this product as needed if not relieved by Colace only.  ?MiraLax (polyethylene glycol) - Pick up over-the-counter to have on hand. MiraLax is a solution that will increase the amount of water in your bowels to assist with bowel movements.  Take as directed and can mix with a glass of water, juice, soda, coffee, or tea. Take if you go more than two days without a movement. Do not use MiraLax more than once per day. Call your doctor if you are still constipated or irregular after using this medication for 7 days in a row. ? ?If you continue  to have problems with postoperative constipation, please contact the office for further assistance and recommendations.  If you experience "the worst abdominal pain ever" or develop nausea or vomiting, please contact the office immediatly for further recommendations for treatment. ? ?ITCHING ?If you experience itching with your medications, try taking only a single pain pill, or even half a pain pill at a time.  You can also use Benadryl over the counter for itching or also to help with sleep.  ? ?MEDICATIONS ?See your medication summary on the ?After Visit Summary? that the nursing staff will review with you prior to discharge.  You may have some home medications which will be placed on hold until you complete the course of blood thinner medication.  It is important for you to complete the blood thinner medication as prescribed by your surgeon.  Continue your approved medications as instructed at time of discharge. ? ?PRECAUTIONS ?If you experience chest pain or shortness of breath - call 911 immediately for transfer to the hospital emergency department.  ?If you develop a fever greater that 101   F, purulent drainage from wound, increased redness or drainage from wound, foul odor from the wound/dressing, or calf pain - CONTACT YOUR SURGEON.   ?                                                ?FOLLOW-UP APPOINTMENTS ?Make sure you keep all of your appointments after your operation with your surgeon and caregivers. You should call the office at the above phone number and make an appointment for approximately two weeks after the date of your surgery or on the date instructed by your surgeon outlined in the "After Visit Summary". ? ?RANGE OF MOTION AND STRENGTHENING EXERCISES  ?Rehabilitation of the knee is important following a knee injury or an operation. After just a few days of immobilization, the muscles of the thigh which control the knee become weakened and shrink (atrophy). Knee exercises are designed to build up  the tone and strength of the thigh muscles and to improve knee motion. Often times heat used for twenty to thirty minutes before working out will loosen up your tissues and help with improving the range of motion

## 2021-12-24 ENCOUNTER — Encounter (HOSPITAL_COMMUNITY): Payer: Self-pay | Admitting: Orthopedic Surgery

## 2021-12-24 DIAGNOSIS — M1711 Unilateral primary osteoarthritis, right knee: Secondary | ICD-10-CM | POA: Diagnosis not present

## 2021-12-24 LAB — CBC
HCT: 25.3 % — ABNORMAL LOW (ref 36.0–46.0)
HCT: 28.9 % — ABNORMAL LOW (ref 36.0–46.0)
Hemoglobin: 7.9 g/dL — ABNORMAL LOW (ref 12.0–15.0)
Hemoglobin: 9 g/dL — ABNORMAL LOW (ref 12.0–15.0)
MCH: 23 pg — ABNORMAL LOW (ref 26.0–34.0)
MCH: 23.1 pg — ABNORMAL LOW (ref 26.0–34.0)
MCHC: 31.2 g/dL (ref 30.0–36.0)
MCHC: 31.1 g/dL (ref 30.0–36.0)
MCV: 73.8 fL — ABNORMAL LOW (ref 80.0–100.0)
MCV: 74.3 fL — ABNORMAL LOW (ref 80.0–100.0)
Platelets: 220 10*3/uL (ref 150–400)
Platelets: 272 10*3/uL (ref 150–400)
RBC: 3.43 MIL/uL — ABNORMAL LOW (ref 3.87–5.11)
RBC: 3.89 MIL/uL (ref 3.87–5.11)
RDW: 17.9 % — ABNORMAL HIGH (ref 11.5–15.5)
RDW: 17.8 % — ABNORMAL HIGH (ref 11.5–15.5)
WBC: 9.2 10*3/uL (ref 4.0–10.5)
WBC: 13.5 10*3/uL — ABNORMAL HIGH (ref 4.0–10.5)
nRBC: 0 % (ref 0.0–0.2)
nRBC: 0 % (ref 0.0–0.2)

## 2021-12-24 LAB — BASIC METABOLIC PANEL
Anion gap: 8 (ref 5–15)
BUN: 32 mg/dL — ABNORMAL HIGH (ref 8–23)
CO2: 24 mmol/L (ref 22–32)
Calcium: 8.4 mg/dL — ABNORMAL LOW (ref 8.9–10.3)
Chloride: 105 mmol/L (ref 98–111)
Creatinine, Ser: 1.51 mg/dL — ABNORMAL HIGH (ref 0.44–1.00)
GFR, Estimated: 36 mL/min — ABNORMAL LOW (ref >60)
Glucose, Bld: 121 mg/dL — ABNORMAL HIGH (ref 70–99)
Potassium: 3.7 mmol/L (ref 3.5–5.1)
Sodium: 137 mmol/L (ref 135–145)

## 2021-12-24 MED ORDER — ASPIRIN 325 MG PO TBEC: 325.0000 mg | DELAYED_RELEASE_TABLET | Freq: Two times a day (BID) | ORAL | 0 refills | Status: AC

## 2021-12-24 MED ORDER — METHOCARBAMOL 500 MG PO TABS: 500.0000 mg | ORAL_TABLET | Freq: Four times a day (QID) | ORAL | 0 refills | Status: AC | PRN

## 2021-12-24 MED ORDER — OXYCODONE HCL 5 MG PO TABS: 5.0000 mg | ORAL_TABLET | Freq: Four times a day (QID) | ORAL | 0 refills | Status: AC | PRN

## 2021-12-24 MED ORDER — TRAMADOL HCL 50 MG PO TABS: 50.0000 mg | ORAL_TABLET | Freq: Four times a day (QID) | ORAL | 0 refills | Status: AC | PRN

## 2021-12-24 NOTE — Plan of Care (Signed)
  Problem: Activity: Goal: Risk for activity intolerance will decrease Outcome: Progressing   Problem: Pain Managment: Goal: General experience of comfort will improve Outcome: Progressing   

## 2021-12-24 NOTE — TOC Transition Note (Signed)
Transition of Care (TOC) - CM/SW Discharge Note ? ?Patient Details  ?Name: DHYANA BASTONE ?MRN: [id number] ?Date of Birth: [birth year] ? ?Transition of Care (TOC) CM/SW Contact:  ?Sherie Don, LCSW ?Phone Number: ?12/24/2021, 10:09 AM ? ?Clinical Narrative: Patient is expected to discharge home after working with PT. CSW met with patient to confirm discharge plan and needs. Patient will go home with OPPT at [facility]. Patient will need a youth rolling walker, which was delivered to patient's room by [employer]. TOC signing off. ? ?Final next level of care: OP Rehab ?Barriers to Discharge: No Barriers Identified ? ?Patient Goals and CMS Choice ?Patient states their goals for this hospitalization and ongoing recovery are:: Discharge home with OPPT at [facility] ?CMS Medicare.gov Compare Post Acute Care list provided to:: Patient ?Choice offered to / list presented to : Patient ? ?Discharge Plan and Services        ?DME Arranged: [employer] ?DME Agency: [employer] ?Representative spoke with at DME Agency: Prearranged in orthopedist's office ? ?Readmission Risk Interventions ?   ? View : No data to display.  ?  ?  ?  ? ?

## 2021-12-24 NOTE — Progress Notes (Signed)
Physical Therapy Treatment ?Patient Details ?Name: Susan Tucker ?MRN: [id number] ?DOB: 1946-03-03 ?Today's Date: 12/24/2021 ? ? ?History of Present Illness Pt is a 76yo female presenting s/p R-TKA on 12/23/21. PMH: CKD, HLD, HTN, L4-L5 back surgery ? ?  ?PT Comments  ? ? POD # 1 pm session ?Assited with amb in hallway and practiced stairs.  General Gait Details: assisted with amb in hallway 32 feet with  HgB 9.0 (improved from 7.9 this morning)  feeling "fine".  Eager to go home. General stair comments: practiced up/down stairs using one rail with 25% VC's on proper sequencing.  Performed twice.  Did well. ?Addressed all mobility questions, discussed appropriate activity, educated on use of ICE.  Pt ready for D/C to home. ?  ?Recommendations for follow up therapy are one component of a multi-disciplinary discharge planning process, led by the attending physician.  Recommendations may be updated based on patient status, additional functional criteria and insurance authorization. ? ?Follow Up Recommendations ? Follow physician's recommendations for discharge plan and follow up therapies ?  ?  ?Assistance Recommended at Discharge Set up Supervision/Assistance  ?Patient can return home with the following A little help with walking and/or transfers;A little help with bathing/dressing/bathroom;Assistance with cooking/housework;Assist for transportation;Help with stairs or ramp for entrance ?  ?Equipment Recommendations ? Rolling walker (2 wheels)  ?  ?Recommendations for Other Services   ? ? ?  ?Precautions / Restrictions Precautions ?Precautions: Fall ?Precaution Comments: instructed no pillow under knee ?Restrictions ?Weight Bearing Restrictions: No ?Other Position/Activity Restrictions: WBAT  ?  ? ?Mobility ? Bed Mobility ?  ?  ?  ?  ?  ?  ?  ?General bed mobility comments: OOB in recliner ?  ? ?Transfers ?Overall transfer level: Needs assistance ?Equipment used: Rolling walker (2 wheels) ?Transfers: Sit to/from  Stand ?Sit to Stand: Supervision ?  ?  ?  ?  ?  ?General transfer comment: 25% VC's on proper hand placement as well as safety with turns. ?  ? ?Ambulation/Gait ?Ambulation/Gait assistance: Min guard, Min assist ?Gait Distance (Feet): 32 Feet ?Assistive device: Rolling walker (2 wheels) ?Gait Pattern/deviations: Decreased step length - left, Decreased stance time - right, Decreased stride length ?Gait velocity: decreased ?  ?  ?General Gait Details: assisted with amb in hallway 32 feet with  HgB 9.0 (improved from 7.9 this morning)  feeling "fine".  Eager to go home. ? ? ?Stairs ?Stairs: Yes ?Stairs assistance: Supervision, Min guard ?Stair Management: One rail Right ?Number of Stairs: 4 ?General stair comments: practiced up/down stairs using one rail with 25% VC's on proper sequencing.  Performed twice.  Did well. ? ? ?Wheelchair Mobility ?  ? ?Modified Rankin (Stroke Patients Only) ?  ? ? ?  ?Balance   ?  ?  ?  ?  ?  ?  ?  ?  ?  ?  ?  ?  ?  ?  ?  ?  ?  ?  ?  ? ?  ?Cognition Arousal/Alertness: Awake/alert ?Behavior During Therapy: [facility] for tasks assessed/performed ?Overall Cognitive Status: Within Functional Limits for tasks assessed ?  ?  ?  ?  ?  ?  ?  ?  ?  ?  ?  ?  ?  ?  ?  ?  ?General Comments: AxO x 3 very pleasant ?  ?  ? ?  ?Exercises   ? ?  ?General Comments   ?  ?  ? ?Pertinent Vitals/Pain Pain Assessment ?Pain Assessment: 0-10 ?  Pain Score: 4  ?Pain Location: right knee, low back ?Pain Descriptors / Indicators: Operative site guarding, Discomfort ?Pain Intervention(s): Monitored during session, Premedicated before session, Repositioned, Ice applied  ? ? ?Home Living   ?  ?  ?  ?  ?  ?  ?  ?  ?  ?   ?  ?Prior Function    ?  ?  ?   ? ?PT Goals (current goals can now be found in the care plan section) Progress towards PT goals: Progressing toward goals ? ?  ?Frequency ? ? ? 7X/week ? ? ? ?  ?PT Plan Current plan remains appropriate  ? ? ?Co-evaluation   ?  ?  ?  ?  ? ?  ?AM-PAC PT "6 Clicks" Mobility    ?Outcome Measure ? Help needed turning from your back to your side while in a flat bed without using bedrails?: None ?Help needed moving from lying on your back to sitting on the side of a flat bed without using bedrails?: None ?Help needed moving to and from a bed to a chair (including a wheelchair)?: None ?Help needed standing up from a chair using your arms (e.g., wheelchair or bedside chair)?: None ?Help needed to walk in hospital room?: A Little ?Help needed climbing 3-5 steps with a railing? : A Little ?6 Click Score: 22 ? ?  ?End of Session Equipment Utilized During Treatment: Gait belt ?Activity Tolerance: Patient tolerated treatment well ?Patient left: in chair;with call bell/phone within reach;with chair alarm set;with SCD's reapplied;with family/visitor present ?Nurse Communication: Mobility status ?PT Visit Diagnosis: Difficulty in walking, not elsewhere classified (R26.2) ?  ? ? ?Time: 3267-1245 ?PT Time Calculation (min) (ACUTE ONLY): 14 min ? ?Charges:  $Gait Training: 8-22 mins         ?          ?Felecia Shelling  PTA ?Acute  Rehabilitation Services ?Pager      [phone] ?Office      [phone] ? ?

## 2021-12-24 NOTE — Progress Notes (Signed)
Physical Therapy Treatment ?Patient Details ?Name: Susan Tucker ?MRN: [id number] ?DOB: Jul 12, 1946 ?Today's Date: 12/24/2021 ? ? ?History of Present Illness Pt is a 76yo female presenting s/p R-TKA on 12/23/21. PMH: CKD, HLD, HTN, L4-L5 back surgery ? ?  ?PT Comments  ? ? POD # 1 am session ?Pt is AxO x 3 very pleasant.  Assisted with amb in hallway and performing some TKR TE's.  General Gait Details: assisted with amb in hallway 24 feet with recliner following as a precaution due to HgB 7.9.  Asymptomatic, just feels "tired".  Tolerated distance well well.  Then returned to room to perform some TE's following HEP handout.  Instructed on proper tech, freq as well as use of ICE.   ?Pt will need another PT session to complete TE's and practice stairs.   ?  ?Recommendations for follow up therapy are one component of a multi-disciplinary discharge planning process, led by the attending physician.  Recommendations may be updated based on patient status, additional functional criteria and insurance authorization. ? ?Follow Up Recommendations ? Follow physician's recommendations for discharge plan and follow up therapies ?  ?  ?Assistance Recommended at Discharge Set up Supervision/Assistance  ?Patient can return home with the following A little help with walking and/or transfers;A little help with bathing/dressing/bathroom;Assistance with cooking/housework;Assist for transportation;Help with stairs or ramp for entrance ?  ?Equipment Recommendations ? Rolling walker (2 wheels)  ?  ?Recommendations for Other Services   ? ? ?  ?Precautions / Restrictions Precautions ?Precautions: Fall ?Precaution Comments: instructed no pillow under knee ?Restrictions ?Weight Bearing Restrictions: No ?Other Position/Activity Restrictions: WBAT  ?  ? ?Mobility ? Bed Mobility ?  ?  ?  ?  ?  ?  ?  ?General bed mobility comments: OOB in recliner ?  ? ?Transfers ?Overall transfer level: Needs assistance ?Equipment used: Rolling walker (2  wheels) ?Transfers: Sit to/from Stand ?Sit to Stand: Supervision ?  ?  ?  ?  ?  ?General transfer comment: 25% VC's on proper hand placement as well as safety with turns. ?  ? ?Ambulation/Gait ?Ambulation/Gait assistance: Min guard, Min assist ?Gait Distance (Feet): 24 Feet ?Assistive device: Rolling walker (2 wheels) ?Gait Pattern/deviations: Decreased step length - left, Decreased stance time - right, Decreased stride length ?Gait velocity: decreased ?  ?  ?General Gait Details: assisted with amb in hallway 24 feet with recliner following as a precaution due to HgB 7.9.  Asymptomatic, just feels "tired".  Tolerated distance well well. ? ? ?Stairs ?  ?  ?  ?  ?  ? ? ?Wheelchair Mobility ?  ? ?Modified Rankin (Stroke Patients Only) ?  ? ? ?  ?Balance   ?  ?  ?  ?  ?  ?  ?  ?  ?  ?  ?  ?  ?  ?  ?  ?  ?  ?  ?  ? ?  ?Cognition Arousal/Alertness: Awake/alert ?Behavior During Therapy: [facility] for tasks assessed/performed ?Overall Cognitive Status: Within Functional Limits for tasks assessed ?  ?  ?  ?  ?  ?  ?  ?  ?  ?  ?  ?  ?  ?  ?  ?  ?General Comments: AxO x 3 very pleasant ?  ?  ? ?  ?Exercises  Total Knee Replacement TE's following HEP handout ?10 reps B LE ankle pumps ?05 reps towel squeezes ?05 reps knee presses ?05 reps heel slides  ? ?Educated on use of  gait belt to assist with TE's ?Followed by ICE ? ? ?  ?General Comments   ?  ?  ? ?Pertinent Vitals/Pain Pain Assessment ?Pain Assessment: 0-10 ?Pain Score: 4  ?Pain Location: right knee, low back ?Pain Descriptors / Indicators: Operative site guarding, Discomfort ?Pain Intervention(s): Monitored during session, Premedicated before session, Repositioned, Ice applied  ? ? ?Home Living   ?  ?  ?  ?  ?  ?  ?  ?  ?  ?   ?  ?Prior Function    ?  ?  ?   ? ?PT Goals (current goals can now be found in the care plan section) Progress towards PT goals: Progressing toward goals ? ?  ?Frequency ? ? ? 7X/week ? ? ? ?  ?PT Plan Current plan remains appropriate  ? ? ?Co-evaluation    ?  ?  ?  ?  ? ?  ?AM-PAC PT "6 Clicks" Mobility   ?Outcome Measure ? Help needed turning from your back to your side while in a flat bed without using bedrails?: A Little ?Help needed moving from lying on your back to sitting on the side of a flat bed without using bedrails?: A Little ?Help needed moving to and from a bed to a chair (including a wheelchair)?: A Little ?Help needed standing up from a chair using your arms (e.g., wheelchair or bedside chair)?: A Little ?Help needed to walk in hospital room?: A Little ?Help needed climbing 3-5 steps with a railing? : A Little ?6 Click Score: 18 ? ?  ?End of Session Equipment Utilized During Treatment: Gait belt ?Activity Tolerance: Patient tolerated treatment well ?Patient left: in chair;with call bell/phone within reach;with chair alarm set;with SCD's reapplied;with family/visitor present ?Nurse Communication: Mobility status ?PT Visit Diagnosis: Difficulty in walking, not elsewhere classified (R26.2) ?  ? ? ?Time: 7989-2119 ?PT Time Calculation (min) (ACUTE ONLY): 33 min ? ?Charges:  $Gait Training: 8-22 mins ?$Therapeutic Exercise: 8-22 mins          ?          ?Felecia Shelling  PTA ?Acute  Rehabilitation Services ?Pager      [phone] ?Office      [phone] ? ?

## 2021-12-24 NOTE — Plan of Care (Signed)
Plan of care reviewed and discussed with the patient. 

## 2021-12-24 NOTE — Progress Notes (Signed)
? ?Subjective: ?1 Day Post-Op Procedure(s) (LRB): ?TOTAL KNEE ARTHROPLASTY (Right) ?Patient reports pain as mild.   ?Patient seen in rounds by Dr. Lequita Halt. ?Patient is well, and has had no acute complaints or problems. No issues overnight. Denies chest pain or SOB. Foley catheter removed this AM. ?We will continue therapy today, ambulated 25' yesterday. ? ?Objective: ?Vital signs in last 24 hours: ?Temp:  [97.6 ?F (36.4 ?C)-98.6 ?F (37 ?C)] 98.1 ?F (36.7 ?C) (05/02 6967) ?Pulse Rate:  [59-98] 72 (05/02 8938) ?Resp:  [12-20] 18 (05/02 1017) ?BP: (106-140)/(51-108) 131/69 (05/02 5102) ?SpO2:  [87 %-100 %] 97 % (05/02 5852) ?Weight:  [69.4 kg] 69.4 kg (05/01 0815) ? ?Intake/Output from previous day: ? ?Intake/Output Summary (Last 24 hours) at 12/24/2021 0759 ?Last data filed at 12/24/2021 7782 ?Gross per 24 hour  ?Intake 3696.21 ml  ?Output 3175 ml  ?Net 521.21 ml  ?  ? ?Intake/Output this shift: ?No intake/output data recorded. ? ?Labs: ?Recent Labs  ?  12/24/21 ?0336  ?HGB 7.9*  ? ?Recent Labs  ?  12/24/21 ?0336  ?WBC 9.2  ?RBC 3.43*  ?HCT 25.3*  ?PLT 220  ? ?Recent Labs  ?  12/24/21 ?0336  ?NA 137  ?K 3.7  ?CL 105  ?CO2 24  ?BUN 32*  ?CREATININE 1.51*  ?GLUCOSE 121*  ?CALCIUM 8.4*  ? ?No results for input(s): LABPT, INR in the last 72 hours. ? ?Exam: ?General - Patient is Alert and Oriented ?Extremity - Neurologically intact ?Neurovascular intact ?Sensation intact distally ?Dorsiflexion/Plantar flexion intact ?Dressing - dressing C/D/I ?Motor Function - intact, moving foot and toes well on exam.  ? ?Past Medical History:  ?Diagnosis Date  ? Adult situational stress disorder historical  ? Allergic rhinitis historical  ? Chronic kidney disease   ? DJD (degenerative joint disease) historical  ? Hip pain, bilateral historical  ? Hx of right knee surgery historical  ? per patient  ? Hyperlipidemia historical  ? Hypertension   ? Renal insufficiency, mild historical  ? Sinus headache historical  ? Vitamin D deficiency  historical  ? ? ?Assessment/Plan: ?1 Day Post-Op Procedure(s) (LRB): ?TOTAL KNEE ARTHROPLASTY (Right) ?Principal Problem: ?  OA (osteoarthritis) of knee ?Active Problems: ?  Primary osteoarthritis of right knee ? ?Estimated body mass index is 27.98 kg/m? as calculated from the following: ?  Height as of this encounter: 5\' 2"  (1.575 m). ?  Weight as of this encounter: 69.4 kg. ?Advance diet ?Up with therapy ?D/C IV fluids ? ? ?Patient's anticipated LOS is less than 2 midnights, meeting these requirements: ?- Lives within 1 hour of care ?- Has a competent adult at home to recover with post-op recover ?- NO history of ? - Chronic pain requiring opioids ? - Diabetes ? - Coronary Artery Disease ? - Heart failure ? - Heart attack ? - Stroke ? - DVT/VTE ? - Cardiac arrhythmia ? - Respiratory Failure/COPD ? - Renal failure ? - Advanced Liver disease ? ?DVT Prophylaxis - Aspirin ?Weight bearing as tolerated. ?Continue therapy. ? ?Creatinine improved. ?Hemoglobin 7.9 from 9.8 preoperatively. Will recheck CBC at 1pm. ? ?Plan is to go Home after hospital stay. ?Possible discharge this afternoon if progresses with therapy and hemoglobin stable at recheck. If lower, will keep until tomorrow so that we can repeat CBC in the AM. ? ?Scheduled for OPPT at [facility]. ?Follow-up in the office in 2 weeks. ? ?The PDMP database was reviewed today prior to any opioid medications being prescribed to this patient. ? ?[facility],  PA-C ?Orthopedic Surgery ?[phone] ?12/24/2021, 7:59 AM ? ?

## 2021-12-25 NOTE — Discharge Summary (Signed)
Patient ID: ?KENDAHL BUMGARDNER ?MRN: [id number] ?DOB/AGE: October 12, 1945 76 y.o. ? ?Admit date: 12/23/2021 ?Discharge date: 12/24/2021 ? ?Admission Diagnoses:  ?Principal Problem: ?  OA (osteoarthritis) of knee ?Active Problems: ?  Primary osteoarthritis of right knee ? ? ?Discharge Diagnoses:  ?Same ? ?Past Medical History:  ?Diagnosis Date  ? Adult situational stress disorder historical  ? Allergic rhinitis historical  ? Chronic kidney disease   ? DJD (degenerative joint disease) historical  ? Hip pain, bilateral historical  ? Hx of right knee surgery historical  ? per patient  ? Hyperlipidemia historical  ? Hypertension   ? Renal insufficiency, mild historical  ? Sinus headache historical  ? Vitamin D deficiency historical  ? ? ?Surgeries: Procedure(s): ?TOTAL KNEE ARTHROPLASTY on 12/23/2021 ?  ?Consultants:  ? ?Discharged Condition: Improved ? ?Hospital Course: CALLEEN ALVIS is an 76 y.o. female who was admitted 12/23/2021 for operative treatment ofOA (osteoarthritis) of knee. Patient has severe unremitting pain that affects sleep, daily activities, and work/hobbies. After pre-op clearance the patient was taken to the operating room on 12/23/2021 and underwent  Procedure(s): ?TOTAL KNEE ARTHROPLASTY.   ? ?Patient was given perioperative antibiotics:  ?Anti-infectives (From admission, onward)  ? ? Start     Dose/Rate Route Frequency Ordered Stop  ? 12/23/21 1800  ceFAZolin (ANCEF) IVPB 2g/100 mL premix       ? 2 g ?200 mL/hr over 30 Minutes Intravenous Every 8 hours 12/23/21 1424 12/24/21 0236  ? 12/23/21 1700  ceFAZolin (ANCEF) IVPB 2g/100 mL premix  Status:  Discontinued       ? 2 g ?200 mL/hr over 30 Minutes Intravenous Every 6 hours 12/23/21 1335 12/23/21 1424  ? 12/23/21 0800  ceFAZolin (ANCEF) IVPB 2g/100 mL premix       ? 2 g ?200 mL/hr over 30 Minutes Intravenous On call to O.R. 12/23/21 0750 12/23/21 1107  ? ?  ?  ? ?Patient was given sequential compression devices, early ambulation, and chemoprophylaxis to  prevent DVT. ? ?Patient benefited maximally from hospital stay and there were no complications.   ? ?Recent vital signs: No data found.  ? ?Recent laboratory studies:  ?Recent Labs  ?  12/24/21 ?0336 12/24/21 ?1238  ?WBC 9.2 13.5*  ?HGB 7.9* 9.0*  ?HCT 25.3* 28.9*  ?PLT 220 272  ?NA 137  --   ?K 3.7  --   ?CL 105  --   ?CO2 24  --   ?BUN 32*  --   ?CREATININE 1.51*  --   ?GLUCOSE 121*  --   ?CALCIUM 8.4*  --   ? ? ? ?Discharge Medications:   ?Allergies as of 12/24/2021   ?No Known Allergies ?  ? ?  ?Medication List  ?  ? ?STOP taking these medications   ? ?meloxicam 15 MG tablet ?Commonly known as: MOBIC ?  ? ?  ? ?TAKE these medications   ? ?ALPRAZolam 0.5 MG tablet ?Commonly known as: Prudy Feeler ?Take 0.5 mg by mouth at bedtime as needed for anxiety. ?  ?aspirin 325 MG EC tablet ?Take 1 tablet (325 mg total) by mouth 2 (two) times daily for 20 days. Then take one 81 mg aspirin once a day for three weeks. Then discontinue aspirin. ?  ?cetirizine 10 MG tablet ?Commonly known as: ZYRTEC ?Take 10 mg by mouth daily. ?  ?ezetimibe 10 MG tablet ?Commonly known as: ZETIA ?Take 10 mg by mouth daily. ?  ?Magnesium Oxide 250 MG Tabs ?Take 250 mg by mouth 2 (two)  times a week. ?  ?methocarbamol 500 MG tablet ?Commonly known as: ROBAXIN ?Take 1 tablet (500 mg total) by mouth every 6 (six) hours as needed for muscle spasms. ?  ?multivitamin tablet ?Take 1 tablet by mouth daily. Centrum ?  ?omeprazole 40 MG capsule ?Commonly known as: PRILOSEC ?Take 40 mg by mouth daily. ?  ?OSTEO BI-FLEX REGULAR STRENGTH PO ?Take by mouth daily. ?  ?oxyCODONE 5 MG immediate release tablet ?Commonly known as: Oxy IR/ROXICODONE ?Take 1-2 tablets (5-10 mg total) by mouth every 6 (six) hours as needed for severe pain. ?  ?PROBIOTIC DAILY PO ?Take 1 tablet by mouth daily. ?  ?pyridoxine 250 MG tablet ?Commonly known as: B-6 ?Take 250 mg by mouth daily. ?  ?rosuvastatin 10 MG tablet ?Commonly known as: CRESTOR ?Take 10 mg by mouth at bedtime. ?  ?traMADol  50 MG tablet ?Commonly known as: ULTRAM ?Take 1-2 tablets (50-100 mg total) by mouth every 6 (six) hours as needed for moderate pain. ?  ?triamterene-hydrochlorothiazide 37.5-25 MG tablet ?Commonly known as: MAXZIDE-25 ?Take 1 tablet by mouth daily. ?  ?vitamin B-12 500 MCG tablet ?Commonly known as: CYANOCOBALAMIN ?Take 500 mcg by mouth daily. ?  ?Vitamin D 125 MCG (5000 UT) Caps ?Take 5,000 Units by mouth daily. ?  ? ?  ? ?  ?  ? ? ?  ?Discharge Care Instructions  ?(From admission, onward)  ?  ? ? ?  ? ?  Start     Ordered  ? 12/24/21 0000  Weight bearing as tolerated       ? 12/24/21 0804  ? 12/24/21 0000  Change dressing       ?Comments: You may remove the bulky bandage (ACE wrap and gauze) two days after surgery. You will have an adhesive waterproof bandage underneath. Leave this in place until your first follow-up appointment.  ? 12/24/21 0804  ? ?  ?  ? ?  ? ? ?Diagnostic Studies: DG Thoracic Spine W/Swimmers ? ?Result Date: 12/05/2021 ?CLINICAL DATA:  Upper thoracic spine pain. EXAM: THORACIC SPINE - 3 VIEWS COMPARISON:  Chest x-ray 03/14/2005. FINDINGS: Mild scoliosis and diffuse prominent multilevel degenerative change again noted. Postsurgical changes lower lumbar spine. No acute bony abnormality identified. No evidence of fracture. Prominent sliding hiatal hernia. Aortic atherosclerotic vascular calcification. IMPRESSION: 1. Mild scoliosis and diffuse prominent multilevel degenerative change of the thoracic spine again noted. Postsurgical changes lower lumbar spine. No acute bony abnormality. 2.  Prominence sliding hiatal hernia. 3.  Aortic atherosclerotic vascular disease. Electronically Signed   By: Maisie Fushomas  Register M.D.   On: 12/05/2021 06:35   ? ?Disposition: Discharge disposition: 01-Home or Self Care ? ? ? ? ? ? ?Discharge Instructions   ? ? Call MD / Call 911   Complete by: As directed ?  ? If you experience chest pain or shortness of breath, CALL 911 and be transported to the hospital emergency  room.  If you develope a fever above 101 F, pus (white drainage) or increased drainage or redness at the wound, or calf pain, call your surgeon's office.  ? Change dressing   Complete by: As directed ?  ? You may remove the bulky bandage (ACE wrap and gauze) two days after surgery. You will have an adhesive waterproof bandage underneath. Leave this in place until your first follow-up appointment.  ? Constipation Prevention   Complete by: As directed ?  ? Drink plenty of fluids.  Prune juice may be helpful.  You may use a  stool softener, such as Colace (over the counter) 100 mg twice a day.  Use MiraLax (over the counter) for constipation as needed.  ? Diet - low sodium heart healthy   Complete by: As directed ?  ? Do not put a pillow under the knee. Place it under the heel.   Complete by: As directed ?  ? Driving restrictions   Complete by: As directed ?  ? No driving for two weeks  ? Post-operative opioid taper instructions:   Complete by: As directed ?  ? POST-OPERATIVE OPIOID TAPER INSTRUCTIONS: ?It is important to wean off of your opioid medication as soon as possible. If you do not need pain medication after your surgery it is ok to stop day one. ?Opioids include: ?Codeine, Hydrocodone(Norco, Vicodin), Oxycodone(Percocet, oxycontin) and hydromorphone amongst others.  ?Long term and even short term use of opiods can cause: ?Increased pain response ?Dependence ?Constipation ?Depression ?Respiratory depression ?And more.  ?Withdrawal symptoms can include ?Flu like symptoms ?Nausea, vomiting ?And more ?Techniques to manage these symptoms ?Hydrate well ?Eat regular healthy meals ?Stay active ?Use relaxation techniques(deep breathing, meditating, yoga) ?Do Not substitute Alcohol to help with tapering ?If you have been on opioids for less than two weeks and do not have pain than it is ok to stop all together.  ?Plan to wean off of opioids ?This plan should start within one week post op of your joint  replacement. ?Maintain the same interval or time between taking each dose and first decrease the dose.  ?Cut the total daily intake of opioids by one tablet each day ?Next start to increase the time between doses. ?The last dos

## 2023-09-01 DIAGNOSIS — R7303 Prediabetes: Secondary | ICD-10-CM | POA: Diagnosis not present

## 2023-09-01 DIAGNOSIS — I119 Hypertensive heart disease without heart failure: Secondary | ICD-10-CM | POA: Diagnosis not present

## 2023-09-01 DIAGNOSIS — M1711 Unilateral primary osteoarthritis, right knee: Secondary | ICD-10-CM | POA: Diagnosis not present

## 2023-09-01 DIAGNOSIS — J301 Allergic rhinitis due to pollen: Secondary | ICD-10-CM | POA: Diagnosis not present

## 2023-09-01 DIAGNOSIS — E782 Mixed hyperlipidemia: Secondary | ICD-10-CM | POA: Diagnosis not present

## 2023-09-01 DIAGNOSIS — N184 Chronic kidney disease, stage 4 (severe): Secondary | ICD-10-CM | POA: Diagnosis not present

## 2023-09-01 DIAGNOSIS — D631 Anemia in chronic kidney disease: Secondary | ICD-10-CM | POA: Diagnosis not present

## 2023-09-01 DIAGNOSIS — M545 Low back pain, unspecified: Secondary | ICD-10-CM | POA: Diagnosis not present

## 2023-09-17 DIAGNOSIS — H43813 Vitreous degeneration, bilateral: Secondary | ICD-10-CM | POA: Diagnosis not present

## 2023-09-17 DIAGNOSIS — H25811 Combined forms of age-related cataract, right eye: Secondary | ICD-10-CM | POA: Diagnosis not present

## 2023-09-17 DIAGNOSIS — H35372 Puckering of macula, left eye: Secondary | ICD-10-CM | POA: Diagnosis not present

## 2023-09-17 DIAGNOSIS — H04123 Dry eye syndrome of bilateral lacrimal glands: Secondary | ICD-10-CM | POA: Diagnosis not present

## 2023-10-22 DIAGNOSIS — Z1231 Encounter for screening mammogram for malignant neoplasm of breast: Secondary | ICD-10-CM | POA: Diagnosis not present

## 2023-10-23 DIAGNOSIS — J3489 Other specified disorders of nose and nasal sinuses: Secondary | ICD-10-CM | POA: Diagnosis not present

## 2023-10-23 DIAGNOSIS — R059 Cough, unspecified: Secondary | ICD-10-CM | POA: Diagnosis not present

## 2023-10-23 DIAGNOSIS — J029 Acute pharyngitis, unspecified: Secondary | ICD-10-CM | POA: Diagnosis not present

## 2023-10-23 DIAGNOSIS — R0982 Postnasal drip: Secondary | ICD-10-CM | POA: Diagnosis not present

## 2023-10-23 DIAGNOSIS — J0100 Acute maxillary sinusitis, unspecified: Secondary | ICD-10-CM | POA: Diagnosis not present

## 2023-10-30 DIAGNOSIS — M8588 Other specified disorders of bone density and structure, other site: Secondary | ICD-10-CM | POA: Diagnosis not present

## 2023-10-30 DIAGNOSIS — R29890 Loss of height: Secondary | ICD-10-CM | POA: Diagnosis not present

## 2023-11-13 DIAGNOSIS — H25811 Combined forms of age-related cataract, right eye: Secondary | ICD-10-CM | POA: Diagnosis not present

## 2023-11-30 DIAGNOSIS — H20041 Secondary noninfectious iridocyclitis, right eye: Secondary | ICD-10-CM | POA: Diagnosis not present

## 2023-11-30 DIAGNOSIS — H34821 Venous engorgement, right eye: Secondary | ICD-10-CM | POA: Diagnosis not present

## 2023-11-30 DIAGNOSIS — H33321 Round hole, right eye: Secondary | ICD-10-CM | POA: Diagnosis not present

## 2023-11-30 DIAGNOSIS — H2512 Age-related nuclear cataract, left eye: Secondary | ICD-10-CM | POA: Diagnosis not present

## 2023-12-01 DIAGNOSIS — H2512 Age-related nuclear cataract, left eye: Secondary | ICD-10-CM | POA: Diagnosis not present

## 2023-12-01 DIAGNOSIS — H34821 Venous engorgement, right eye: Secondary | ICD-10-CM | POA: Diagnosis not present

## 2023-12-01 DIAGNOSIS — H33321 Round hole, right eye: Secondary | ICD-10-CM | POA: Diagnosis not present

## 2023-12-01 DIAGNOSIS — H20041 Secondary noninfectious iridocyclitis, right eye: Secondary | ICD-10-CM | POA: Diagnosis not present

## 2023-12-01 DIAGNOSIS — H2141 Pupillary membranes, right eye: Secondary | ICD-10-CM | POA: Diagnosis not present

## 2023-12-07 DIAGNOSIS — J301 Allergic rhinitis due to pollen: Secondary | ICD-10-CM | POA: Diagnosis not present

## 2023-12-07 DIAGNOSIS — N1832 Chronic kidney disease, stage 3b: Secondary | ICD-10-CM | POA: Diagnosis not present

## 2023-12-07 DIAGNOSIS — H21541 Posterior synechiae (iris), right eye: Secondary | ICD-10-CM | POA: Diagnosis not present

## 2023-12-07 DIAGNOSIS — Z9849 Cataract extraction status, unspecified eye: Secondary | ICD-10-CM | POA: Diagnosis not present

## 2023-12-07 DIAGNOSIS — I119 Hypertensive heart disease without heart failure: Secondary | ICD-10-CM | POA: Diagnosis not present

## 2023-12-07 DIAGNOSIS — R7303 Prediabetes: Secondary | ICD-10-CM | POA: Diagnosis not present

## 2023-12-07 DIAGNOSIS — D631 Anemia in chronic kidney disease: Secondary | ICD-10-CM | POA: Diagnosis not present

## 2023-12-07 DIAGNOSIS — M1711 Unilateral primary osteoarthritis, right knee: Secondary | ICD-10-CM | POA: Diagnosis not present

## 2023-12-07 DIAGNOSIS — M545 Low back pain, unspecified: Secondary | ICD-10-CM | POA: Diagnosis not present

## 2023-12-07 DIAGNOSIS — E782 Mixed hyperlipidemia: Secondary | ICD-10-CM | POA: Diagnosis not present

## 2023-12-10 DIAGNOSIS — H34821 Venous engorgement, right eye: Secondary | ICD-10-CM | POA: Diagnosis not present

## 2023-12-10 DIAGNOSIS — H33321 Round hole, right eye: Secondary | ICD-10-CM | POA: Diagnosis not present

## 2023-12-10 DIAGNOSIS — H2512 Age-related nuclear cataract, left eye: Secondary | ICD-10-CM | POA: Diagnosis not present

## 2023-12-21 DIAGNOSIS — H2512 Age-related nuclear cataract, left eye: Secondary | ICD-10-CM | POA: Diagnosis not present

## 2023-12-21 DIAGNOSIS — H20041 Secondary noninfectious iridocyclitis, right eye: Secondary | ICD-10-CM | POA: Diagnosis not present

## 2023-12-21 DIAGNOSIS — H33321 Round hole, right eye: Secondary | ICD-10-CM | POA: Diagnosis not present

## 2023-12-21 DIAGNOSIS — H34821 Venous engorgement, right eye: Secondary | ICD-10-CM | POA: Diagnosis not present

## 2023-12-24 ENCOUNTER — Other Ambulatory Visit: Payer: Self-pay

## 2023-12-24 ENCOUNTER — Encounter (HOSPITAL_COMMUNITY): Payer: Self-pay | Admitting: Ophthalmology

## 2023-12-24 DIAGNOSIS — H2512 Age-related nuclear cataract, left eye: Secondary | ICD-10-CM | POA: Diagnosis not present

## 2023-12-24 DIAGNOSIS — H44001 Unspecified purulent endophthalmitis, right eye: Secondary | ICD-10-CM | POA: Diagnosis not present

## 2023-12-24 DIAGNOSIS — H33321 Round hole, right eye: Secondary | ICD-10-CM | POA: Diagnosis not present

## 2023-12-24 DIAGNOSIS — H34821 Venous engorgement, right eye: Secondary | ICD-10-CM | POA: Diagnosis not present

## 2023-12-24 NOTE — Progress Notes (Signed)
 SDW CALL  Patient was given pre-op instructions over the phone. The opportunity was given for the patient to ask questions. No further questions asked. Patient verbalized understanding of instructions given.   PCP - Dr. Jolan Natal at [facility] Cardiologist - denies  PPM/ICD - denies   Chest x-ray - denies EKG - DOS Stress Test - denies ECHO - denies Cardiac Cath - denies  Sleep Study - denies CPAP - n/a  No DM  Last dose of GLP1 agonist-  n/a GLP1 instructions: n/a  Blood Thinner Instructions: n/a Aspirin  Instructions: n/a  ERAS Protcol - clears until 0430   COVID TEST- n/a   Anesthesia review: no  Patient denies shortness of breath, fever, cough and chest pain over the phone call   All instructions explained to the patient, with a verbal understanding of the material. Patient agrees to go over the instructions while at home for a better understanding.

## 2023-12-24 NOTE — Anesthesia Preprocedure Evaluation (Signed)
 Anesthesia Evaluation  Patient identified by MRN, date of birth, ID band Patient awake    Reviewed: Allergy & Precautions, H&P , NPO status , Patient's Chart, lab work & pertinent test results  Airway Mallampati: II  TM Distance: >3 FB Neck ROM: Full    Dental no notable dental hx.    Pulmonary former smoker   Pulmonary exam normal breath sounds clear to auscultation       Cardiovascular hypertension, Normal cardiovascular exam Rhythm:Regular Rate:Normal     Neuro/Psych  Headaches PSYCHIATRIC DISORDERS Anxiety        GI/Hepatic Neg liver ROS,GERD  ,,  Endo/Other  negative endocrine ROS    Renal/GU CRFRenal disease  negative genitourinary   Musculoskeletal  (+) Arthritis ,    Abdominal   Peds negative pediatric ROS (+)  Hematology negative hematology ROS (+)   Anesthesia Other Findings   Reproductive/Obstetrics negative OB ROS                             Anesthesia Physical Anesthesia Plan  ASA: 2  Anesthesia Plan: General   Post-op Pain Management: Tylenol  PO (pre-op)*   Induction: Intravenous  PONV Risk Score and Plan: Ondansetron  and Dexamethasone   Airway Management Planned: Oral ETT  Additional Equipment: None  Intra-op Plan:   Post-operative Plan: Extubation in OR  Informed Consent: I have reviewed the patients History and Physical, chart, labs and discussed the procedure including the risks, benefits and alternatives for the proposed anesthesia with the patient or authorized representative who has indicated his/her understanding and acceptance.     Dental advisory given  Plan Discussed with: CRNA  Anesthesia Plan Comments:        Anesthesia Quick Evaluation

## 2023-12-25 ENCOUNTER — Encounter (HOSPITAL_COMMUNITY)
Admission: RE | Disposition: A | Discharge status: Home / Self Care | Payer: Self-pay | Source: Home / Self Care | Attending: Ophthalmology

## 2023-12-25 ENCOUNTER — Ambulatory Visit (HOSPITAL_COMMUNITY)
Admission: RE | Admit: 2023-12-25 | Discharge: 2023-12-25 | Disposition: A | Discharge status: Home / Self Care | Attending: Ophthalmology | Admitting: Ophthalmology

## 2023-12-25 ENCOUNTER — Ambulatory Visit (HOSPITAL_COMMUNITY): Payer: Self-pay | Admitting: Anesthesiology

## 2023-12-25 ENCOUNTER — Encounter (HOSPITAL_COMMUNITY): Payer: Self-pay | Admitting: Ophthalmology

## 2023-12-25 DIAGNOSIS — T8140XA Infection following a procedure, unspecified, initial encounter: Secondary | ICD-10-CM | POA: Diagnosis not present

## 2023-12-25 DIAGNOSIS — Z87891 Personal history of nicotine dependence: Secondary | ICD-10-CM | POA: Insufficient documentation

## 2023-12-25 DIAGNOSIS — I10 Essential (primary) hypertension: Secondary | ICD-10-CM | POA: Diagnosis not present

## 2023-12-25 DIAGNOSIS — X58XXXA Exposure to other specified factors, initial encounter: Secondary | ICD-10-CM | POA: Insufficient documentation

## 2023-12-25 DIAGNOSIS — K219 Gastro-esophageal reflux disease without esophagitis: Secondary | ICD-10-CM | POA: Insufficient documentation

## 2023-12-25 DIAGNOSIS — H2141 Pupillary membranes, right eye: Secondary | ICD-10-CM | POA: Diagnosis not present

## 2023-12-25 DIAGNOSIS — H21541 Posterior synechiae (iris), right eye: Secondary | ICD-10-CM | POA: Diagnosis not present

## 2023-12-25 DIAGNOSIS — H44001 Unspecified purulent endophthalmitis, right eye: Secondary | ICD-10-CM

## 2023-12-25 DIAGNOSIS — H44009 Unspecified purulent endophthalmitis, unspecified eye: Secondary | ICD-10-CM | POA: Diagnosis not present

## 2023-12-25 DIAGNOSIS — M1990 Unspecified osteoarthritis, unspecified site: Secondary | ICD-10-CM | POA: Diagnosis not present

## 2023-12-25 HISTORY — DX: Gastro-esophageal reflux disease without esophagitis: K21.9

## 2023-12-25 HISTORY — PX: PARS PLANA VITRECTOMY: SHX2166

## 2023-12-25 LAB — CBC
HCT: 35.3 % — ABNORMAL LOW (ref 36.0–46.0)
Hemoglobin: 12.4 g/dL (ref 12.0–15.0)
MCH: 32.1 pg (ref 26.0–34.0)
MCHC: 35.1 g/dL (ref 30.0–36.0)
MCV: 91.5 fL (ref 80.0–100.0)
Platelets: 226 10*3/uL (ref 150–400)
RBC: 3.86 MIL/uL — ABNORMAL LOW (ref 3.87–5.11)
RDW: 13.2 % (ref 11.5–15.5)
WBC: 5.3 10*3/uL (ref 4.0–10.5)
nRBC: 0 % (ref 0.0–0.2)

## 2023-12-25 LAB — BASIC METABOLIC PANEL WITH GFR
Anion gap: 11 (ref 5–15)
BUN: 30 mg/dL — ABNORMAL HIGH (ref 8–23)
CO2: 25 mmol/L (ref 22–32)
Calcium: 9.1 mg/dL (ref 8.9–10.3)
Chloride: 106 mmol/L (ref 98–111)
Creatinine, Ser: 1.92 mg/dL — ABNORMAL HIGH (ref 0.44–1.00)
GFR, Estimated: 26 mL/min — ABNORMAL LOW (ref >60)
Glucose, Bld: 89 mg/dL (ref 70–99)
Potassium: 3.8 mmol/L (ref 3.5–5.1)
Sodium: 142 mmol/L (ref 135–145)

## 2023-12-25 SURGERY — PARS PLANA VITRECTOMY 25 GAUGE FOR ENDOPHTHALMITIS
Anesthesia: General | Site: Eye | Laterality: Right

## 2023-12-25 MED ORDER — TETRACAINE HCL 0.5 % OP SOLN
OPHTHALMIC | Status: AC
  Filled 2023-12-25: qty 4

## 2023-12-25 MED ORDER — FENTANYL CITRATE (PF) 100 MCG/2ML IJ SOLN: 25.0000 ug | INTRAMUSCULAR | Status: DC | PRN

## 2023-12-25 MED ORDER — SODIUM HYALURONATE 10 MG/ML IO SOLUTION
PREFILLED_SYRINGE | INTRAOCULAR | Status: AC
  Filled 2023-12-25: qty 0.85

## 2023-12-25 MED ORDER — ORAL CARE MOUTH RINSE: 15.0000 mL | Freq: Once | OROMUCOSAL | Status: AC

## 2023-12-25 MED ORDER — DEXAMETHASONE SODIUM PHOSPHATE 10 MG/ML IJ SOLN
INTRAMUSCULAR | Status: AC
  Filled 2023-12-25: qty 1

## 2023-12-25 MED ORDER — SODIUM CHLORIDE 0.9% FLUSH: 3.0000 mL | Freq: Two times a day (BID) | INTRAVENOUS | Status: DC

## 2023-12-25 MED ORDER — PHENYLEPHRINE HCL 2.5 % OP SOLN
1.0000 [drp] | OPHTHALMIC | Status: AC | PRN
  Administered 2023-12-25 (×3): 1 [drp] via OPHTHALMIC
  Filled 2023-12-25: qty 2

## 2023-12-25 MED ORDER — BSS PLUS IO SOLN
INTRAOCULAR | Status: AC
  Filled 2023-12-25: qty 500

## 2023-12-25 MED ORDER — TOBRAMYCIN-DEXAMETHASONE 0.3-0.1 % OP OINT
TOPICAL_OINTMENT | OPHTHALMIC | Status: AC
  Filled 2023-12-25: qty 3.5

## 2023-12-25 MED ORDER — LIDOCAINE 2% (20 MG/ML) 5 ML SYRINGE
INTRAMUSCULAR | Status: AC
  Filled 2023-12-25: qty 5

## 2023-12-25 MED ORDER — EPINEPHRINE PF 1 MG/ML IJ SOLN
INTRAOCULAR | Status: DC | PRN
  Administered 2023-12-25: 500 mL

## 2023-12-25 MED ORDER — NA CHONDROIT SULF-NA HYALURON 40-30 MG/ML IO SOSY
INTRAOCULAR | Status: AC
  Filled 2023-12-25: qty 0.5

## 2023-12-25 MED ORDER — PROPOFOL 10 MG/ML IV BOLUS
INTRAVENOUS | Status: AC
  Filled 2023-12-25: qty 20

## 2023-12-25 MED ORDER — ACETAMINOPHEN 500 MG PO TABS
1000.0000 mg | ORAL_TABLET | Freq: Once | ORAL | Status: DC
  Filled 2023-12-25: qty 2

## 2023-12-25 MED ORDER — ROCURONIUM BROMIDE 10 MG/ML (PF) SYRINGE
PREFILLED_SYRINGE | INTRAVENOUS | Status: AC
  Filled 2023-12-25: qty 10

## 2023-12-25 MED ORDER — LIDOCAINE HCL 2 % IJ SOLN
INTRAMUSCULAR | Status: DC | PRN
  Administered 2023-12-25: 3 mL

## 2023-12-25 MED ORDER — SODIUM CHLORIDE 0.9 % IV SOLN: INTRAVENOUS | Status: DC

## 2023-12-25 MED ORDER — CYCLOPENTOLATE HCL 1 % OP SOLN
1.0000 [drp] | OPHTHALMIC | Status: AC
  Administered 2023-12-25 (×3): 1 [drp] via OPHTHALMIC
  Filled 2023-12-25: qty 2

## 2023-12-25 MED ORDER — ONDANSETRON HCL 4 MG/2ML IJ SOLN
INTRAMUSCULAR | Status: AC
  Filled 2023-12-25: qty 2

## 2023-12-25 MED ORDER — POLYMYXIN B SULFATE 500000 UNITS IJ SOLR
INTRAMUSCULAR | Status: AC
  Filled 2023-12-25: qty 10

## 2023-12-25 MED ORDER — CEFTAZIDIME INTRAVITREAL INJECTION 2.25 MG/0.1 ML
2.2500 mg | INTRAVITREAL | Status: AC
Start: 2023-12-25 — End: 2023-12-25
  Administered 2023-12-25: 2.25 mg via INTRAVITREAL
  Filled 2023-12-25: qty 0.1

## 2023-12-25 MED ORDER — PROPOFOL 10 MG/ML IV BOLUS
INTRAVENOUS | Status: DC | PRN
  Administered 2023-12-25: 20 mg via INTRAVENOUS

## 2023-12-25 MED ORDER — MIDAZOLAM HCL 2 MG/2ML IJ SOLN
INTRAMUSCULAR | Status: AC
Start: 2023-12-25 — End: ?
  Filled 2023-12-25: qty 2

## 2023-12-25 MED ORDER — GATIFLOXACIN 0.5 % OP SOLN
1.0000 [drp] | OPHTHALMIC | Status: DC | PRN
  Administered 2023-12-25 (×2): 1 [drp] via OPHTHALMIC
  Filled 2023-12-25: qty 2.5

## 2023-12-25 MED ORDER — LIDOCAINE HCL 2 % IJ SOLN
INTRAMUSCULAR | Status: AC
  Filled 2023-12-25: qty 20

## 2023-12-25 MED ORDER — SODIUM CHLORIDE 0.9% FLUSH: 3.0000 mL | INTRAVENOUS | Status: DC | PRN

## 2023-12-25 MED ORDER — TOBRAMYCIN 0.3 % OP OINT
TOPICAL_OINTMENT | OPHTHALMIC | Status: DC | PRN
Start: 2023-12-25 — End: 2023-12-25
  Administered 2023-12-25: 1 via OPHTHALMIC

## 2023-12-25 MED ORDER — SODIUM CHLORIDE (PF) 0.9 % IJ SOLN
INTRAMUSCULAR | Status: AC
  Filled 2023-12-25: qty 10

## 2023-12-25 MED ORDER — LACTATED RINGERS IV SOLN: INTRAVENOUS | Status: DC

## 2023-12-25 MED ORDER — NA CHONDROIT SULF-NA HYALURON 40-30 MG/ML IO SOSY
INTRAOCULAR | Status: DC | PRN
  Administered 2023-12-25: .5 mL via INTRAOCULAR

## 2023-12-25 MED ORDER — EPINEPHRINE PF 1 MG/ML IJ SOLN
INTRAMUSCULAR | Status: AC
  Filled 2023-12-25: qty 1

## 2023-12-25 MED ORDER — SODIUM CHLORIDE 0.9 % IV SOLN: INTRAVENOUS | Status: DC | PRN

## 2023-12-25 MED ORDER — OXYCODONE HCL 5 MG PO TABS: 5.0000 mg | ORAL_TABLET | Freq: Once | ORAL | Status: DC | PRN

## 2023-12-25 MED ORDER — ACETAMINOPHEN 10 MG/ML IV SOLN: 1000.0000 mg | Freq: Once | INTRAVENOUS | Status: DC | PRN

## 2023-12-25 MED ORDER — OXYCODONE HCL 5 MG/5ML PO SOLN: 5.0000 mg | Freq: Once | ORAL | Status: DC | PRN

## 2023-12-25 MED ORDER — FENTANYL CITRATE (PF) 250 MCG/5ML IJ SOLN
INTRAMUSCULAR | Status: AC
  Filled 2023-12-25: qty 5

## 2023-12-25 MED ORDER — VANCOMYCIN INTRAVITREAL INJECTION 1 MG/0.1 ML
1.0000 mg | INTRAOCULAR | Status: AC
Start: 2023-12-25 — End: 2023-12-25
  Administered 2023-12-25: 1 mg via INTRAVITREAL
  Filled 2023-12-25: qty 0.02

## 2023-12-25 MED ORDER — TETRACAINE HCL 0.5 % OP SOLN
OPHTHALMIC | Status: DC | PRN
  Administered 2023-12-25: 2 [drp] via OPHTHALMIC

## 2023-12-25 MED ORDER — ROPIVACAINE HCL 5 MG/ML IJ SOLN
INTRAMUSCULAR | Status: DC | PRN
  Administered 2023-12-25: 3 mL

## 2023-12-25 MED ORDER — DROPERIDOL 2.5 MG/ML IJ SOLN: 0.6250 mg | Freq: Once | INTRAMUSCULAR | Status: DC | PRN

## 2023-12-25 MED ORDER — ROPIVACAINE HCL 5 MG/ML IJ SOLN
INTRAMUSCULAR | Status: AC
  Filled 2023-12-25: qty 30

## 2023-12-25 MED ORDER — GENTAMICIN SULFATE 40 MG/ML IJ SOLN
INTRAMUSCULAR | Status: AC
  Filled 2023-12-25: qty 2

## 2023-12-25 MED ORDER — CHLORHEXIDINE GLUCONATE 0.12 % MT SOLN
15.0000 mL | Freq: Once | OROMUCOSAL | Status: AC
  Administered 2023-12-25: 15 mL via OROMUCOSAL
  Filled 2023-12-25: qty 15

## 2023-12-25 MED ORDER — DEXAMETHASONE SODIUM PHOSPHATE 10 MG/ML IJ SOLN
INTRAMUSCULAR | Status: DC | PRN
  Administered 2023-12-25: 10 mg

## 2023-12-25 MED ORDER — TOBRAMYCIN-DEXAMETHASONE 0.3-0.1 % OP SUSP
OPHTHALMIC | Status: AC
  Filled 2023-12-25: qty 2.5

## 2023-12-25 SURGICAL SUPPLY — 43 items
APPLICATOR COTTON TIP 6 STRL (MISCELLANEOUS) ×1 IMPLANT
APPLICATOR COTTON TIP 6IN STRL (MISCELLANEOUS) ×1 IMPLANT
APPLICATOR DR MATTHEWS STRL (MISCELLANEOUS) IMPLANT
BAND WRIST GAS GREEN (MISCELLANEOUS) IMPLANT
BLADE MVR KNIFE 20G (BLADE) IMPLANT
BNDG EYE OVAL 2 1/8 X 2 5/8 (GAUZE/BANDAGES/DRESSINGS) IMPLANT
CANNULA ANT CHAM MAIN (OPHTHALMIC RELATED) IMPLANT
CANNULA FLEX TIP 25G (CANNULA) IMPLANT
CANNULA TROCAR 25 GA VLV (OPHTHALMIC) ×1 IMPLANT
CANNULA TROCAR 25G 4 VLV (OPHTHALMIC) IMPLANT
CANNULA VLV SOFT TIP 25G (OPHTHALMIC) ×1 IMPLANT
CANNULA VLV SOFT TIP 25GA (OPHTHALMIC) ×1 IMPLANT
CORD BIPOLAR FORCEPS 12FT (ELECTRODE) IMPLANT
COVER MAYO STAND STRL (DRAPES) ×1 IMPLANT
DRAPE OPHTHALMIC 77X100 STRL (CUSTOM PROCEDURE TRAY) ×1 IMPLANT
FILTER STRAW FLUID ASPIR (MISCELLANEOUS) IMPLANT
FORCEPS ECKARDT ILM 25G SERR (OPHTHALMIC RELATED) ×1 IMPLANT
GLOVE SS BIOGEL STRL SZ 8.5 (GLOVE) ×1 IMPLANT
GOWN STRL REUS W/ TWL LRG LVL3 (GOWN DISPOSABLE) ×1 IMPLANT
KIT BASIN OR (CUSTOM PROCEDURE TRAY) ×1 IMPLANT
KIT TURNOVER KIT B (KITS) ×1 IMPLANT
LENS BIOM SUPER VIEW SET DISP (MISCELLANEOUS) ×1 IMPLANT
NDL 18GX1X1/2 (RX/OR ONLY) (NEEDLE) ×1 IMPLANT
NDL 25GX 5/8IN NON SAFETY (NEEDLE) ×1 IMPLANT
NDL HYPO 30X.5 LL (NEEDLE) ×2 IMPLANT
NEEDLE 18GX1X1/2 (RX/OR ONLY) (NEEDLE) ×1 IMPLANT
NEEDLE 25GX 5/8IN NON SAFETY (NEEDLE) ×1 IMPLANT
NEEDLE HYPO 30X.5 LL (NEEDLE) ×2 IMPLANT
PACK VITRECTOMY CUSTOM (CUSTOM PROCEDURE TRAY) ×1 IMPLANT
PAD ARMBOARD POSITIONER FOAM (MISCELLANEOUS) ×2 IMPLANT
PAK PIK VITRECTOMY CVS 25GA (OPHTHALMIC) ×1 IMPLANT
PENCIL BIPOLAR 25GA STR DISP (OPHTHALMIC RELATED) IMPLANT
RESERVOIR BACK FLUSH (MISCELLANEOUS) IMPLANT
ROLLS DENTAL (MISCELLANEOUS) IMPLANT
SHIELD EYE LENSE ONLY DISP (GAUZE/BANDAGES/DRESSINGS) IMPLANT
STOCKINETTE IMPERVIOUS 9X36 MD (GAUZE/BANDAGES/DRESSINGS) ×2 IMPLANT
STOPCOCK 4 WAY LG BORE MALE ST (IV SETS) IMPLANT
STRIP CLOSURE SKIN 1/2X4 (GAUZE/BANDAGES/DRESSINGS) IMPLANT
SUT ETHILON 10 0 CS140 6 (SUTURE) IMPLANT
SWAB COLLECTION DEVICE MRSA (MISCELLANEOUS) ×1 IMPLANT
SWAB CULTURE ESWAB REG 1ML (MISCELLANEOUS) ×1 IMPLANT
SYR TB 1ML LUER SLIP (SYRINGE) ×2 IMPLANT
TOWEL GREEN STERILE FF (TOWEL DISPOSABLE) ×2 IMPLANT

## 2023-12-25 NOTE — H&P (Signed)
 78 year old woman with 23 weeks post cataract surgery uncomplicated with intraocular lens placement.  OD.  Found to have postop inflammatory response.  Seen initially some 2 weeks previous by Dr. Seward Dao and found to have mature debris in the anterior chamber and iris bombe.  YAG laser peripheral iridectomy successful alleviating condition however progressive synechia formation posteriorly good recurrence of the iris bombe.  Repeat YAG laser PIs were performed successfully.  Patient has gone about 7 -8 days with no response however the laser PIs of scarred over again triggering iris bombe.  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  Retinal tear OD  Plan  Vitrectomy with lysis of posterior synechia OD  Focal laser photocoagulation OD of possible as well as sector peripheral iridectomy OD  In the operating room under local MAC anesthesia with peribulbar injection of Xylocaine  Naropin  OD

## 2023-12-25 NOTE — Transfer of Care (Signed)
 Immediate Anesthesia Transfer of Care Note  Patient: Susan Tucker  Procedure(s) Performed: PARS PLANA VITRECTOMY 25 GAUGE FOR ENDOPHTHALMITIS (Right)  Patient Location: PACU  Anesthesia Type:MAC  Level of Consciousness: awake, alert , and oriented  Airway & Oxygen Therapy: Patient Spontanous Breathing  Post-op Assessment: Report given to RN and Post -op Vital signs reviewed and stable  Post vital signs: Reviewed and stable  Last Vitals:  Vitals Value Taken Time  BP 124/74 12/25/23 0852  Temp    Pulse 63 12/25/23 0853  Resp 15 12/25/23 0855  SpO2 91 % 12/25/23 0853  Vitals shown include unfiled device data.  Last Pain:  Vitals:   12/25/23 0608  PainSc: 0-No pain         Complications: No notable events documented.

## 2023-12-25 NOTE — Brief Op Note (Signed)
 Preoperative diagnosis  1.  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  Surgical peripheral iridectomy x 3 at 3, 5 and 9 positions  Anesthesia is local with MAC, peribulbar injection irrigation  Surgeon Rochell Chroman, MD  Complications none  Specimens vitreous washings and aqueous aspirate sent for cultures Gram stain

## 2023-12-25 NOTE — Op Note (Signed)
 Dictation per phone system  [phone] dictation number

## 2023-12-25 NOTE — Op Note (Signed)
 NAME: Susan Tucker, Susan Tucker MEDICAL RECORD NO: [id number] ACCOUNT NO: [id number] DATE OF BIRTH: 25-Feb-1946 FACILITY: [facility] LOCATION: [facility] PHYSICIAN: Susan Tucker. Susan Kopf, MD  Operative Report   DATE OF PROCEDURE: 12/25/2023  PREOPERATIVE DIAGNOSES: 1.  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 2.  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  POSTOPERATIVE DIAGNOSES: 1.  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 2.  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  PROCEDURES PERFORMED: 1.  Posterior vitrectomy, diagnostic and therapeutic with delivery of intravitreal antibiotic - vancomycin  1.0 mg/0.1 mL with a volume of 0.1 mL injected and ceftazidime  2.5 mg/0.1 mL, volume of 0.1 mL injected. 2.  Lysis of posterior synechiae of iris to the lens capsule OD. 3.  Surgical peripheral iridectomy x 3 at 3, 5, and 9 o'clock positions.  ANESTHESIA:  Local MAC with peribulbar injection/irrigation.  SURGEON:  Susan Chroman, MD  COMPLICATIONS:  None.  SPECIMENS: 1.  Vitreous washings for cytospin, gram stain, and culture. 2.  Aqueous aspirate sent also for cultures and gram stain for fastidious organisms.  INDICATIONS:  The patient is a 78 year old woman who is approximately 3 weeks post cataract surgery,  uncomplicated with intraocular lens implantation by Dr. Levan Tucker.  The patient is found to have inflammatory response.  The patient was seen in evaluation  by me approximately two-and-a-half weeks previous, found at that time to have inflammatory debris and iris bomb?Susan Tucker  This was relieved with YAG laser PI.  Intraocular pressures were normalized.  No corneal iris touch remained.  However, inflammatory debris  closed off the patent PI over about a 4-day period and thus, recurrent iris bomb? developed.  YAG laser PIs x 2 were now completed and enlarged.  These remained patent for 7 days and then the patient  presented with findings of angle closure via iris  bomb? with normal pressures.  Inflammatory reaction in the anterior chamber was noted again.  Concerned that this was a chronic endophthalmitis because it is not responsive to topical antibiotics and also intensive topical steroids.  No cells were ever  seen in the vitreous, however.  Nonetheless, because of the risk of chronic indolent endophthalmitis causing these inflammatory membranes, this eye is to be approached as possible infection.  The patient understands an attempt to surgically release the scarring, to deliver intravitreal  antibiotics, culture the media, and then deliver intensive periocular antibiotics thereafter to clear the condition and stabilize the ocular condition.  The patient understands the risks of anesthesia including recurrence, death, but also the risk of  eye from the underlying condition in the surgery including but not limited to hemorrhage, infection, scarring, need for another surgery, no change in vision, loss of vision, and progression of disease despite intervention.  After appropriate signed  consent was obtained, the patient was taken to the operating room.   PROCEDURE IN DETAIL:  In the operating room, appropriate monitoring was followed by mild station.  Surgical timeout had been carried out with staff and the surgeon.  Thereafter, topical Tetracaine  applied.  Sterile ophthalmic prep and drape was carried  out.  Lid speculum applied.  A small incision was made in the inferotemporal fornix of the conjunctiva with Westcott scissors to allow for irrigation and peribulbar injection mixture of 2% Xylocaine  1:1 with Naropin  0.5%.  Approximately 5 mL was used in  this fashion.  This obtained excellent anesthesia and akinesia.  At this time, because of the anterior chamber shallowing, it  was required that I use a 30-gauge needle to aspirate some of the natural fluid of the aqueous.  This was plated directly into a  fastidious transport media for aerobic, anaerobic, and  fastidious organisms.  At this time, a 25-gauge trocar was then placed in the anterior chamber to allow for infusion to be placed.  Infusion was then placed under low infusion to reform the anterior chamber.  Corneal touch of the iris in the inferior and  nasal quadrants was identified.  I was then able to use cyclodialysis spatula to release the posterior synechiae of the iris to the lens capsule.  These were very adherent particularly superiorly.  PIs that had been previously fashioned with laser had  been closed again.  At this time, I traversed the anterior chamber with vitrectomy instrumentation creating enlargement of the PIs at the 5 o'clock and 3 o'clock positions from the anterior approach.  Similarly, at 9 o'clock position, a small PI was  created from the anterior approach.  At this time, I was able to place the posterior trocars and transfer the infusion to the posterior segment.  Thereafter the superior trocars were applied.  I did, after clearance of the anterior chamber, did deepen the anterior chamber with Viscoat to  maintain it.  At this time, I was able to proceed with vitrectomy.  Notable findings were the vitreous haze-like flare, but no cellular debris and no accumulated fibrinous debris were noted in the vitreous cavity.  I was able to complete the vitrectomy and vitreous,  although there was some limitation due to poor dilation.  I did use scleral depression to look for retinal holes and tears peripherally.  There was no hole or tear peripherally.  I was then able to from a posterior approach enlarge the temporal PI at the 9 o'clock position going transcapsular peripherally.  This would assure that there would be no chance of sequestration or iris bomb? formation again.  This made the eye  unicameral eye without disruption of the central capsule.  The intraocular lens remained stable throughout.  At this time, I removed  the instruments from the eye.  The antibiotics for intravitreal use have been ordered some hour and a half prior to this point of the procedure.  Contacting the pharmacy, there was still a delay.  We waited another 12 minutes in  the operating room for the intravitreal antibiotics to be sent from pharmacy.  At this time, the amounts above were injected after having had removed the trocars and the infusion.  Intraocular pressures were maintained.  Topical TobraDex  ointment was  applied to the ocular surface.  Lid speculum was removed.  Sterile patch of fox shield was applied to the right eye.  The patient tolerated the procedure well without complication and taken to the PACU.    MUK D: 12/25/2023 9:05:04 am T: 12/25/2023 9:21:00 am  JOB: [id number]/ [id number]

## 2023-12-27 NOTE — Anesthesia Postprocedure Evaluation (Signed)
 Anesthesia Post Note  Patient: Susan Tucker  Procedure(s) Performed: PARS PLANA VITRECTOMY 25 GAUGE FOR ENDOPHTHALMITIS (Right: Eye)     Patient location during evaluation: PACU Anesthesia Type: General Level of consciousness: awake and alert Pain management: pain level controlled Vital Signs Assessment: post-procedure vital signs reviewed and stable Respiratory status: spontaneous breathing, nonlabored ventilation, respiratory function stable and patient connected to nasal cannula oxygen Cardiovascular status: stable and blood pressure returned to baseline Postop Assessment: no apparent nausea or vomiting Anesthetic complications: no   No notable events documented.  Last Vitals:  Vitals:   12/25/23 0900 12/25/23 0912  BP: 124/63 124/67  Pulse: 67 63  Resp: 19 19  Temp:  36.6 C  SpO2: 100% 100%    Last Pain:  Vitals:   12/25/23 0912  PainSc: 0-No pain                 Lethaniel Rave

## 2023-12-28 ENCOUNTER — Encounter (HOSPITAL_COMMUNITY): Payer: Self-pay | Admitting: Ophthalmology

## 2023-12-28 LAB — CYTOLOGY - NON PAP

## 2023-12-30 LAB — AEROBIC/ANAEROBIC CULTURE W GRAM STAIN (SURGICAL/DEEP WOUND)
Gram Stain: NONE SEEN
Gram Stain: NONE SEEN

## 2024-01-05 DIAGNOSIS — H33321 Round hole, right eye: Secondary | ICD-10-CM | POA: Diagnosis not present

## 2024-01-05 DIAGNOSIS — H34821 Venous engorgement, right eye: Secondary | ICD-10-CM | POA: Diagnosis not present

## 2024-01-05 DIAGNOSIS — H2512 Age-related nuclear cataract, left eye: Secondary | ICD-10-CM | POA: Diagnosis not present

## 2024-01-23 LAB — FUNGUS CULTURE WITH STAIN

## 2024-01-23 LAB — FUNGAL ORGANISM REFLEX

## 2024-01-23 LAB — FUNGUS CULTURE RESULT

## 2024-01-26 DIAGNOSIS — H33321 Round hole, right eye: Secondary | ICD-10-CM | POA: Diagnosis not present

## 2024-01-26 DIAGNOSIS — H34821 Venous engorgement, right eye: Secondary | ICD-10-CM | POA: Diagnosis not present

## 2024-01-26 DIAGNOSIS — H2512 Age-related nuclear cataract, left eye: Secondary | ICD-10-CM | POA: Diagnosis not present

## 2024-02-10 DIAGNOSIS — H21541 Posterior synechiae (iris), right eye: Secondary | ICD-10-CM | POA: Diagnosis not present

## 2024-02-10 DIAGNOSIS — H26491 Other secondary cataract, right eye: Secondary | ICD-10-CM | POA: Diagnosis not present

## 2024-02-10 DIAGNOSIS — H33321 Round hole, right eye: Secondary | ICD-10-CM | POA: Diagnosis not present

## 2024-02-10 DIAGNOSIS — H34821 Venous engorgement, right eye: Secondary | ICD-10-CM | POA: Diagnosis not present

## 2024-02-24 DIAGNOSIS — H2000 Unspecified acute and subacute iridocyclitis: Secondary | ICD-10-CM | POA: Diagnosis not present

## 2024-02-24 DIAGNOSIS — H33321 Round hole, right eye: Secondary | ICD-10-CM | POA: Diagnosis not present

## 2024-02-24 DIAGNOSIS — H04123 Dry eye syndrome of bilateral lacrimal glands: Secondary | ICD-10-CM | POA: Diagnosis not present

## 2024-02-24 DIAGNOSIS — Z961 Presence of intraocular lens: Secondary | ICD-10-CM | POA: Diagnosis not present

## 2024-03-07 DIAGNOSIS — H34821 Venous engorgement, right eye: Secondary | ICD-10-CM | POA: Diagnosis not present

## 2024-03-07 DIAGNOSIS — H2512 Age-related nuclear cataract, left eye: Secondary | ICD-10-CM | POA: Diagnosis not present

## 2024-03-07 DIAGNOSIS — H21541 Posterior synechiae (iris), right eye: Secondary | ICD-10-CM | POA: Diagnosis not present

## 2024-03-07 DIAGNOSIS — H20041 Secondary noninfectious iridocyclitis, right eye: Secondary | ICD-10-CM | POA: Diagnosis not present

## 2024-03-07 DIAGNOSIS — H44001 Unspecified purulent endophthalmitis, right eye: Secondary | ICD-10-CM | POA: Diagnosis not present

## 2024-03-07 DIAGNOSIS — H33321 Round hole, right eye: Secondary | ICD-10-CM | POA: Diagnosis not present

## 2024-03-07 DIAGNOSIS — H26491 Other secondary cataract, right eye: Secondary | ICD-10-CM | POA: Diagnosis not present

## 2024-03-14 DIAGNOSIS — I119 Hypertensive heart disease without heart failure: Secondary | ICD-10-CM | POA: Diagnosis not present

## 2024-03-14 DIAGNOSIS — N1832 Chronic kidney disease, stage 3b: Secondary | ICD-10-CM | POA: Diagnosis not present

## 2024-03-14 DIAGNOSIS — M1711 Unilateral primary osteoarthritis, right knee: Secondary | ICD-10-CM | POA: Diagnosis not present

## 2024-03-14 DIAGNOSIS — D631 Anemia in chronic kidney disease: Secondary | ICD-10-CM | POA: Diagnosis not present

## 2024-03-14 DIAGNOSIS — M545 Low back pain, unspecified: Secondary | ICD-10-CM | POA: Diagnosis not present

## 2024-03-14 DIAGNOSIS — R7303 Prediabetes: Secondary | ICD-10-CM | POA: Diagnosis not present

## 2024-03-14 DIAGNOSIS — J301 Allergic rhinitis due to pollen: Secondary | ICD-10-CM | POA: Diagnosis not present

## 2024-03-14 DIAGNOSIS — E782 Mixed hyperlipidemia: Secondary | ICD-10-CM | POA: Diagnosis not present

## 2024-03-14 DIAGNOSIS — E790 Hyperuricemia without signs of inflammatory arthritis and tophaceous disease: Secondary | ICD-10-CM | POA: Diagnosis not present

## 2024-03-28 DIAGNOSIS — H04123 Dry eye syndrome of bilateral lacrimal glands: Secondary | ICD-10-CM | POA: Diagnosis not present

## 2024-03-28 DIAGNOSIS — H33321 Round hole, right eye: Secondary | ICD-10-CM | POA: Diagnosis not present

## 2024-03-28 DIAGNOSIS — Z961 Presence of intraocular lens: Secondary | ICD-10-CM | POA: Diagnosis not present

## 2024-03-28 DIAGNOSIS — H2000 Unspecified acute and subacute iridocyclitis: Secondary | ICD-10-CM | POA: Diagnosis not present

## 2024-04-05 DIAGNOSIS — H26491 Other secondary cataract, right eye: Secondary | ICD-10-CM | POA: Diagnosis not present

## 2024-04-05 DIAGNOSIS — H34821 Venous engorgement, right eye: Secondary | ICD-10-CM | POA: Diagnosis not present

## 2024-04-05 DIAGNOSIS — H33321 Round hole, right eye: Secondary | ICD-10-CM | POA: Diagnosis not present

## 2024-04-05 DIAGNOSIS — H21541 Posterior synechiae (iris), right eye: Secondary | ICD-10-CM | POA: Diagnosis not present

## 2024-04-05 DIAGNOSIS — H2512 Age-related nuclear cataract, left eye: Secondary | ICD-10-CM | POA: Diagnosis not present

## 2024-04-05 DIAGNOSIS — H20041 Secondary noninfectious iridocyclitis, right eye: Secondary | ICD-10-CM | POA: Diagnosis not present

## 2024-05-03 DIAGNOSIS — H35373 Puckering of macula, bilateral: Secondary | ICD-10-CM | POA: Diagnosis not present

## 2024-05-03 DIAGNOSIS — H20041 Secondary noninfectious iridocyclitis, right eye: Secondary | ICD-10-CM | POA: Diagnosis not present

## 2024-05-03 DIAGNOSIS — H26491 Other secondary cataract, right eye: Secondary | ICD-10-CM | POA: Diagnosis not present

## 2024-05-03 DIAGNOSIS — H21541 Posterior synechiae (iris), right eye: Secondary | ICD-10-CM | POA: Diagnosis not present

## 2024-05-03 DIAGNOSIS — H2512 Age-related nuclear cataract, left eye: Secondary | ICD-10-CM | POA: Diagnosis not present

## 2024-05-03 DIAGNOSIS — H34821 Venous engorgement, right eye: Secondary | ICD-10-CM | POA: Diagnosis not present

## 2024-05-03 DIAGNOSIS — H33321 Round hole, right eye: Secondary | ICD-10-CM | POA: Diagnosis not present

## 2024-05-30 DIAGNOSIS — H2000 Unspecified acute and subacute iridocyclitis: Secondary | ICD-10-CM | POA: Diagnosis not present

## 2024-05-30 DIAGNOSIS — Z961 Presence of intraocular lens: Secondary | ICD-10-CM | POA: Diagnosis not present

## 2024-05-30 DIAGNOSIS — H04123 Dry eye syndrome of bilateral lacrimal glands: Secondary | ICD-10-CM | POA: Diagnosis not present

## 2024-05-30 DIAGNOSIS — H33321 Round hole, right eye: Secondary | ICD-10-CM | POA: Diagnosis not present

## 2024-06-20 DIAGNOSIS — N1832 Chronic kidney disease, stage 3b: Secondary | ICD-10-CM | POA: Diagnosis not present

## 2024-06-20 DIAGNOSIS — E782 Mixed hyperlipidemia: Secondary | ICD-10-CM | POA: Diagnosis not present

## 2024-06-20 DIAGNOSIS — R7303 Prediabetes: Secondary | ICD-10-CM | POA: Diagnosis not present

## 2024-06-20 DIAGNOSIS — M1711 Unilateral primary osteoarthritis, right knee: Secondary | ICD-10-CM | POA: Diagnosis not present

## 2024-06-20 DIAGNOSIS — Z23 Encounter for immunization: Secondary | ICD-10-CM | POA: Diagnosis not present

## 2024-06-20 DIAGNOSIS — I119 Hypertensive heart disease without heart failure: Secondary | ICD-10-CM | POA: Diagnosis not present

## 2024-06-20 DIAGNOSIS — D631 Anemia in chronic kidney disease: Secondary | ICD-10-CM | POA: Diagnosis not present

## 2024-06-20 DIAGNOSIS — M545 Low back pain, unspecified: Secondary | ICD-10-CM | POA: Diagnosis not present

## 2024-06-20 DIAGNOSIS — J301 Allergic rhinitis due to pollen: Secondary | ICD-10-CM | POA: Diagnosis not present
